# Patient Record
Sex: Female | Born: 1935 | Race: Black or African American | Hispanic: No
Health system: Southern US, Community
[De-identification: ages and names within clinical notes are randomized; demographics above are authoritative.]

## PROBLEM LIST (undated history)

## (undated) DIAGNOSIS — F99 Mental disorder, not otherwise specified: Secondary | ICD-10-CM

## (undated) DIAGNOSIS — E860 Dehydration: Secondary | ICD-10-CM

## (undated) DIAGNOSIS — I10 Essential (primary) hypertension: Secondary | ICD-10-CM

## (undated) DIAGNOSIS — C799 Secondary malignant neoplasm of unspecified site: Secondary | ICD-10-CM

## (undated) HISTORY — DX: Essential (primary) hypertension: I10

## (undated) HISTORY — DX: Secondary malignant neoplasm of unspecified site: C79.9

## (undated) HISTORY — DX: Mental disorder, not otherwise specified: F99

## (undated) HISTORY — DX: Dehydration: E86.0

---

## 1997-09-01 ENCOUNTER — Ambulatory Visit (HOSPITAL_COMMUNITY): Admission: RE | Admit: 1997-09-01 | Discharge: 1997-09-01 | Payer: Self-pay | Admitting: Internal Medicine

## 1997-10-27 ENCOUNTER — Other Ambulatory Visit: Admission: RE | Admit: 1997-10-27 | Discharge: 1997-10-27 | Payer: Self-pay | Admitting: Internal Medicine

## 1998-03-22 ENCOUNTER — Emergency Department (HOSPITAL_COMMUNITY): Admission: EM | Admit: 1998-03-22 | Discharge: 1998-03-22 | Payer: Self-pay | Admitting: Internal Medicine

## 1998-03-22 ENCOUNTER — Encounter: Payer: Self-pay | Admitting: Internal Medicine

## 1998-09-07 ENCOUNTER — Ambulatory Visit (HOSPITAL_COMMUNITY): Admission: RE | Admit: 1998-09-07 | Discharge: 1998-09-07 | Payer: Self-pay | Admitting: Internal Medicine

## 1999-02-25 ENCOUNTER — Ambulatory Visit (HOSPITAL_COMMUNITY): Admission: RE | Admit: 1999-02-25 | Discharge: 1999-02-25 | Payer: Self-pay | Admitting: Gastroenterology

## 1999-09-13 ENCOUNTER — Encounter: Payer: Self-pay | Admitting: Internal Medicine

## 1999-09-13 ENCOUNTER — Ambulatory Visit (HOSPITAL_COMMUNITY): Admission: RE | Admit: 1999-09-13 | Discharge: 1999-09-13 | Payer: Self-pay | Admitting: Unknown Physician Specialty

## 2000-09-25 ENCOUNTER — Ambulatory Visit (HOSPITAL_COMMUNITY): Admission: RE | Admit: 2000-09-25 | Discharge: 2000-09-25 | Payer: Self-pay | Admitting: Internal Medicine

## 2000-09-25 ENCOUNTER — Encounter: Payer: Self-pay | Admitting: Internal Medicine

## 2000-12-25 ENCOUNTER — Encounter: Admission: RE | Admit: 2000-12-25 | Discharge: 2001-03-25 | Payer: Self-pay | Admitting: Internal Medicine

## 2001-06-18 ENCOUNTER — Encounter: Admission: RE | Admit: 2001-06-18 | Discharge: 2001-09-16 | Payer: Self-pay | Admitting: Internal Medicine

## 2001-09-27 ENCOUNTER — Encounter: Payer: Self-pay | Admitting: Internal Medicine

## 2001-09-27 ENCOUNTER — Ambulatory Visit (HOSPITAL_COMMUNITY): Admission: RE | Admit: 2001-09-27 | Discharge: 2001-09-27 | Payer: Self-pay | Admitting: Internal Medicine

## 2002-03-18 ENCOUNTER — Encounter: Admission: RE | Admit: 2002-03-18 | Discharge: 2002-03-18 | Payer: Self-pay | Admitting: Internal Medicine

## 2002-03-18 ENCOUNTER — Encounter: Payer: Self-pay | Admitting: Internal Medicine

## 2002-06-14 ENCOUNTER — Other Ambulatory Visit: Admission: RE | Admit: 2002-06-14 | Discharge: 2002-06-14 | Payer: Self-pay | Admitting: Diagnostic Radiology

## 2002-06-14 ENCOUNTER — Encounter: Payer: Self-pay | Admitting: Internal Medicine

## 2002-06-14 ENCOUNTER — Encounter (INDEPENDENT_AMBULATORY_CARE_PROVIDER_SITE_OTHER): Payer: Self-pay | Admitting: Specialist

## 2002-06-14 ENCOUNTER — Encounter: Admission: RE | Admit: 2002-06-14 | Discharge: 2002-06-14 | Payer: Self-pay | Admitting: Internal Medicine

## 2002-07-01 ENCOUNTER — Encounter: Payer: Self-pay | Admitting: General Surgery

## 2002-07-01 ENCOUNTER — Encounter: Admission: RE | Admit: 2002-07-01 | Discharge: 2002-07-01 | Payer: Self-pay | Admitting: General Surgery

## 2002-07-03 ENCOUNTER — Encounter (INDEPENDENT_AMBULATORY_CARE_PROVIDER_SITE_OTHER): Payer: Self-pay

## 2002-07-03 ENCOUNTER — Encounter: Payer: Self-pay | Admitting: General Surgery

## 2002-07-03 ENCOUNTER — Ambulatory Visit (HOSPITAL_BASED_OUTPATIENT_CLINIC_OR_DEPARTMENT_OTHER): Admission: RE | Admit: 2002-07-03 | Discharge: 2002-07-04 | Payer: Self-pay | Admitting: General Surgery

## 2002-07-18 ENCOUNTER — Ambulatory Visit (HOSPITAL_COMMUNITY): Admission: RE | Admit: 2002-07-18 | Discharge: 2002-07-18 | Payer: Self-pay | Admitting: Oncology

## 2002-07-18 ENCOUNTER — Encounter: Payer: Self-pay | Admitting: Oncology

## 2002-07-23 ENCOUNTER — Ambulatory Visit: Admission: RE | Admit: 2002-07-23 | Discharge: 2002-10-21 | Payer: Self-pay | Admitting: *Deleted

## 2002-12-23 ENCOUNTER — Other Ambulatory Visit: Admission: RE | Admit: 2002-12-23 | Discharge: 2002-12-23 | Payer: Self-pay | Admitting: Internal Medicine

## 2003-01-06 ENCOUNTER — Encounter: Payer: Self-pay | Admitting: Internal Medicine

## 2003-01-06 ENCOUNTER — Encounter: Admission: RE | Admit: 2003-01-06 | Discharge: 2003-01-06 | Payer: Self-pay | Admitting: Internal Medicine

## 2003-02-03 ENCOUNTER — Encounter: Admission: RE | Admit: 2003-02-03 | Discharge: 2003-02-03 | Payer: Self-pay | Admitting: General Surgery

## 2003-06-16 ENCOUNTER — Encounter: Admission: RE | Admit: 2003-06-16 | Discharge: 2003-06-16 | Payer: Self-pay | Admitting: Internal Medicine

## 2004-01-26 ENCOUNTER — Ambulatory Visit: Payer: Self-pay | Admitting: Oncology

## 2004-03-29 ENCOUNTER — Encounter: Admission: RE | Admit: 2004-03-29 | Discharge: 2004-03-29 | Payer: Self-pay | Admitting: Internal Medicine

## 2004-07-23 ENCOUNTER — Ambulatory Visit: Payer: Self-pay | Admitting: Oncology

## 2004-07-26 ENCOUNTER — Encounter: Admission: RE | Admit: 2004-07-26 | Discharge: 2004-07-26 | Payer: Self-pay | Admitting: General Surgery

## 2004-08-02 ENCOUNTER — Encounter: Admission: RE | Admit: 2004-08-02 | Discharge: 2004-08-02 | Payer: Self-pay | Admitting: General Surgery

## 2005-01-28 ENCOUNTER — Ambulatory Visit: Payer: Self-pay | Admitting: Oncology

## 2005-08-02 ENCOUNTER — Encounter: Admission: RE | Admit: 2005-08-02 | Discharge: 2005-08-02 | Payer: Self-pay | Admitting: General Surgery

## 2006-01-25 ENCOUNTER — Ambulatory Visit: Payer: Self-pay | Admitting: Oncology

## 2006-01-30 LAB — CBC WITH DIFFERENTIAL/PLATELET
BASO%: 0.2 % (ref 0.0–2.0)
Eosinophils Absolute: 0.1 10*3/uL (ref 0.0–0.5)
MCHC: 33.5 g/dL (ref 32.0–36.0)
MCV: 88.7 fL (ref 81.0–101.0)
MONO%: 12 % (ref 0.0–13.0)
NEUT#: 2.3 10*3/uL (ref 1.5–6.5)
RBC: 3.96 10*6/uL (ref 3.70–5.32)
RDW: 15.1 % — ABNORMAL HIGH (ref 11.3–14.5)
WBC: 3.7 10*3/uL — ABNORMAL LOW (ref 3.9–10.0)

## 2006-08-14 ENCOUNTER — Encounter: Admission: RE | Admit: 2006-08-14 | Discharge: 2006-08-14 | Payer: Self-pay | Admitting: General Surgery

## 2007-01-18 ENCOUNTER — Ambulatory Visit: Payer: Self-pay | Admitting: Oncology

## 2007-01-22 LAB — CBC WITH DIFFERENTIAL/PLATELET
BASO%: 1.2 % (ref 0.0–2.0)
Eosinophils Absolute: 0.2 10*3/uL (ref 0.0–0.5)
LYMPH%: 23.3 % (ref 14.0–48.0)
MCHC: 34.7 g/dL (ref 32.0–36.0)
MONO#: 0.4 10*3/uL (ref 0.1–0.9)
NEUT#: 2.4 10*3/uL (ref 1.5–6.5)
Platelets: 178 10*3/uL (ref 145–400)
RBC: 4.01 10*6/uL (ref 3.70–5.32)
RDW: 14.6 % — ABNORMAL HIGH (ref 11.3–14.5)
WBC: 4 10*3/uL (ref 3.9–10.0)
lymph#: 0.9 10*3/uL (ref 0.9–3.3)

## 2007-01-22 LAB — COMPREHENSIVE METABOLIC PANEL
ALT: 14 U/L (ref 0–35)
Albumin: 3.8 g/dL (ref 3.5–5.2)
CO2: 25 mEq/L (ref 19–32)
Calcium: 9.8 mg/dL (ref 8.4–10.5)
Chloride: 104 mEq/L (ref 96–112)
Glucose, Bld: 94 mg/dL (ref 70–99)
Potassium: 3.9 mEq/L (ref 3.5–5.3)
Sodium: 141 mEq/L (ref 135–145)
Total Protein: 7.3 g/dL (ref 6.0–8.3)

## 2007-01-22 LAB — CANCER ANTIGEN 27.29: CA 27.29: 30 U/mL (ref 0–39)

## 2007-01-26 ENCOUNTER — Encounter (INDEPENDENT_AMBULATORY_CARE_PROVIDER_SITE_OTHER): Payer: Self-pay | Admitting: Urology

## 2007-01-26 ENCOUNTER — Ambulatory Visit (HOSPITAL_BASED_OUTPATIENT_CLINIC_OR_DEPARTMENT_OTHER): Admission: RE | Admit: 2007-01-26 | Discharge: 2007-01-26 | Payer: Self-pay | Admitting: Urology

## 2007-05-04 ENCOUNTER — Encounter (INDEPENDENT_AMBULATORY_CARE_PROVIDER_SITE_OTHER): Payer: Self-pay | Admitting: Urology

## 2007-05-04 ENCOUNTER — Ambulatory Visit (HOSPITAL_BASED_OUTPATIENT_CLINIC_OR_DEPARTMENT_OTHER): Admission: RE | Admit: 2007-05-04 | Discharge: 2007-05-04 | Payer: Self-pay | Admitting: Urology

## 2007-07-11 ENCOUNTER — Ambulatory Visit: Payer: Self-pay | Admitting: Oncology

## 2007-07-16 LAB — CBC WITH DIFFERENTIAL/PLATELET
EOS%: 4.4 % (ref 0.0–7.0)
LYMPH%: 21.4 % (ref 14.0–48.0)
MCH: 29.3 pg (ref 26.0–34.0)
MCV: 86.4 fL (ref 81.0–101.0)
MONO%: 10.7 % (ref 0.0–13.0)
RBC: 3.99 10*6/uL (ref 3.70–5.32)
RDW: 14.9 % — ABNORMAL HIGH (ref 11.3–14.5)

## 2007-08-16 ENCOUNTER — Encounter: Admission: RE | Admit: 2007-08-16 | Discharge: 2007-08-16 | Payer: Self-pay | Admitting: Oncology

## 2007-11-20 ENCOUNTER — Ambulatory Visit (HOSPITAL_COMMUNITY): Admission: RE | Admit: 2007-11-20 | Discharge: 2007-11-20 | Payer: Self-pay | Admitting: Internal Medicine

## 2008-01-10 ENCOUNTER — Ambulatory Visit: Payer: Self-pay | Admitting: Oncology

## 2008-01-14 LAB — CBC WITH DIFFERENTIAL/PLATELET
BASO%: 0.4 % (ref 0.0–2.0)
HCT: 33.9 % — ABNORMAL LOW (ref 34.8–46.6)
MCHC: 33.7 g/dL (ref 32.0–36.0)
MONO#: 0.3 10*3/uL (ref 0.1–0.9)
NEUT%: 69.3 % (ref 39.6–76.8)
RBC: 3.84 10*6/uL (ref 3.70–5.32)
WBC: 4 10*3/uL (ref 3.9–10.0)
lymph#: 0.8 10*3/uL — ABNORMAL LOW (ref 0.9–3.3)

## 2008-01-15 LAB — COMPREHENSIVE METABOLIC PANEL
ALT: 13 U/L (ref 0–35)
Albumin: 3.6 g/dL (ref 3.5–5.2)
CO2: 24 mEq/L (ref 19–32)
Calcium: 9.3 mg/dL (ref 8.4–10.5)
Chloride: 104 mEq/L (ref 96–112)
Potassium: 3.7 mEq/L (ref 3.5–5.3)
Sodium: 140 mEq/L (ref 135–145)
Total Protein: 6.8 g/dL (ref 6.0–8.3)

## 2008-01-15 LAB — CANCER ANTIGEN 27.29: CA 27.29: 28 U/mL (ref 0–39)

## 2008-01-26 ENCOUNTER — Ambulatory Visit (HOSPITAL_COMMUNITY): Admission: RE | Admit: 2008-01-26 | Discharge: 2008-01-26 | Payer: Self-pay | Admitting: Urology

## 2008-05-02 ENCOUNTER — Ambulatory Visit (HOSPITAL_COMMUNITY): Admission: RE | Admit: 2008-05-02 | Discharge: 2008-05-02 | Payer: Self-pay | Admitting: Urology

## 2008-08-20 ENCOUNTER — Encounter: Admission: RE | Admit: 2008-08-20 | Discharge: 2008-08-20 | Payer: Self-pay | Admitting: Oncology

## 2008-11-21 ENCOUNTER — Ambulatory Visit (HOSPITAL_COMMUNITY): Admission: RE | Admit: 2008-11-21 | Discharge: 2008-11-21 | Payer: Self-pay | Admitting: Urology

## 2008-11-24 ENCOUNTER — Ambulatory Visit (HOSPITAL_COMMUNITY): Admission: RE | Admit: 2008-11-24 | Discharge: 2008-11-24 | Payer: Self-pay | Admitting: Urology

## 2009-01-01 ENCOUNTER — Ambulatory Visit: Payer: Self-pay | Admitting: Oncology

## 2009-01-13 ENCOUNTER — Inpatient Hospital Stay (HOSPITAL_COMMUNITY): Admission: AD | Admit: 2009-01-13 | Discharge: 2009-01-30 | Payer: Self-pay | Admitting: Internal Medicine

## 2009-01-20 ENCOUNTER — Ambulatory Visit: Payer: Self-pay | Admitting: Internal Medicine

## 2009-01-25 ENCOUNTER — Encounter: Payer: Self-pay | Admitting: Internal Medicine

## 2009-02-06 ENCOUNTER — Encounter: Admission: RE | Admit: 2009-02-06 | Discharge: 2009-02-06 | Payer: Self-pay | Admitting: General Surgery

## 2009-02-17 ENCOUNTER — Encounter: Admission: RE | Admit: 2009-02-17 | Discharge: 2009-02-17 | Payer: Self-pay | Admitting: General Surgery

## 2009-03-20 ENCOUNTER — Ambulatory Visit: Payer: Self-pay | Admitting: Oncology

## 2009-04-16 ENCOUNTER — Encounter (INDEPENDENT_AMBULATORY_CARE_PROVIDER_SITE_OTHER): Payer: Self-pay | Admitting: General Surgery

## 2009-04-18 ENCOUNTER — Inpatient Hospital Stay (HOSPITAL_COMMUNITY): Admission: RE | Admit: 2009-04-18 | Discharge: 2009-04-21 | Payer: Self-pay | Admitting: General Surgery

## 2009-04-18 ENCOUNTER — Encounter (INDEPENDENT_AMBULATORY_CARE_PROVIDER_SITE_OTHER): Payer: Self-pay | Admitting: General Surgery

## 2009-04-20 ENCOUNTER — Encounter (INDEPENDENT_AMBULATORY_CARE_PROVIDER_SITE_OTHER): Payer: Self-pay | Admitting: General Surgery

## 2009-04-20 ENCOUNTER — Ambulatory Visit: Payer: Self-pay | Admitting: Surgery

## 2009-04-20 ENCOUNTER — Ambulatory Visit: Payer: Self-pay | Admitting: Oncology

## 2009-04-22 ENCOUNTER — Ambulatory Visit: Payer: Self-pay | Admitting: Oncology

## 2009-04-27 LAB — PROTIME-INR: INR: 2.3 (ref 2.00–3.50)

## 2009-04-27 LAB — CBC WITH DIFFERENTIAL/PLATELET
BASO%: 0.3 % (ref 0.0–2.0)
EOS%: 3.1 % (ref 0.0–7.0)
HCT: 28.1 % — ABNORMAL LOW (ref 34.8–46.6)
MCH: 29 pg (ref 25.1–34.0)
MCHC: 32.8 g/dL (ref 31.5–36.0)
MCV: 88.4 fL (ref 79.5–101.0)
MONO%: 6.3 % (ref 0.0–14.0)
NEUT%: 77 % — ABNORMAL HIGH (ref 38.4–76.8)
lymph#: 1 10*3/uL (ref 0.9–3.3)

## 2009-04-27 LAB — COMPREHENSIVE METABOLIC PANEL
ALT: 30 U/L (ref 0–35)
AST: 40 U/L — ABNORMAL HIGH (ref 0–37)
BUN: 9 mg/dL (ref 6–23)
Creatinine, Ser: 1.13 mg/dL (ref 0.40–1.20)
Total Bilirubin: 0.5 mg/dL (ref 0.3–1.2)

## 2009-05-04 LAB — PROTIME-INR: Protime: 33.6 Seconds — ABNORMAL HIGH (ref 10.6–13.4)

## 2009-05-11 LAB — PROTIME-INR
INR: 1.9 — ABNORMAL LOW (ref 2.00–3.50)
Protime: 22.8 Seconds — ABNORMAL HIGH (ref 10.6–13.4)

## 2009-05-18 LAB — COMPREHENSIVE METABOLIC PANEL
AST: 37 U/L (ref 0–37)
Albumin: 3.2 g/dL — ABNORMAL LOW (ref 3.5–5.2)
Alkaline Phosphatase: 59 U/L (ref 39–117)
Glucose, Bld: 139 mg/dL — ABNORMAL HIGH (ref 70–99)
Potassium: 3.1 mEq/L — ABNORMAL LOW (ref 3.5–5.3)
Sodium: 145 mEq/L (ref 135–145)
Total Protein: 6.9 g/dL (ref 6.0–8.3)

## 2009-05-18 LAB — CBC WITH DIFFERENTIAL/PLATELET
Basophils Absolute: 0 10*3/uL (ref 0.0–0.1)
Eosinophils Absolute: 0.2 10*3/uL (ref 0.0–0.5)
HCT: 31.6 % — ABNORMAL LOW (ref 34.8–46.6)
HGB: 10.5 g/dL — ABNORMAL LOW (ref 11.6–15.9)
LYMPH%: 26.3 % (ref 14.0–49.7)
MONO#: 0.3 10*3/uL (ref 0.1–0.9)
NEUT#: 2.5 10*3/uL (ref 1.5–6.5)
Platelets: 152 10*3/uL (ref 145–400)
RBC: 3.55 10*6/uL — ABNORMAL LOW (ref 3.70–5.45)
WBC: 4.1 10*3/uL (ref 3.9–10.3)

## 2009-05-18 LAB — PROTIME-INR: Protime: 31.2 Seconds — ABNORMAL HIGH (ref 10.6–13.4)

## 2009-05-29 ENCOUNTER — Ambulatory Visit: Payer: Self-pay | Admitting: Oncology

## 2009-06-02 ENCOUNTER — Ambulatory Visit (HOSPITAL_COMMUNITY): Admission: RE | Admit: 2009-06-02 | Discharge: 2009-06-02 | Payer: Self-pay | Admitting: Oncology

## 2009-06-02 LAB — CBC WITH DIFFERENTIAL/PLATELET
Basophils Absolute: 0 10*3/uL (ref 0.0–0.1)
EOS%: 4.5 % (ref 0.0–7.0)
HCT: 34.3 % — ABNORMAL LOW (ref 34.8–46.6)
HGB: 11.1 g/dL — ABNORMAL LOW (ref 11.6–15.9)
MCH: 28.1 pg (ref 25.1–34.0)
MCV: 86.8 fL (ref 79.5–101.0)
MONO%: 6.8 % (ref 0.0–14.0)
NEUT%: 61.5 % (ref 38.4–76.8)

## 2009-06-02 LAB — COMPREHENSIVE METABOLIC PANEL
AST: 33 U/L (ref 0–37)
Alkaline Phosphatase: 57 U/L (ref 39–117)
BUN: 14 mg/dL (ref 6–23)
Calcium: 9.4 mg/dL (ref 8.4–10.5)
Creatinine, Ser: 0.94 mg/dL (ref 0.40–1.20)

## 2009-06-02 LAB — PROTIME-INR: INR: 1.6 — ABNORMAL LOW (ref 2.00–3.50)

## 2009-06-08 LAB — PROTIME-INR: INR: 2.1 (ref 2.00–3.50)

## 2009-06-12 LAB — BASIC METABOLIC PANEL
BUN: 10 mg/dL (ref 4–21)
Creatinine: 1 mg/dL (ref 0.5–1.1)

## 2009-06-25 LAB — PROTIME-INR: INR: 1.2 — ABNORMAL LOW (ref 2.00–3.50)

## 2009-06-26 ENCOUNTER — Ambulatory Visit: Payer: Self-pay | Admitting: Oncology

## 2009-06-26 ENCOUNTER — Ambulatory Visit (HOSPITAL_COMMUNITY): Admission: RE | Admit: 2009-06-26 | Discharge: 2009-06-26 | Payer: Self-pay | Admitting: Oncology

## 2009-06-29 LAB — PROTIME-INR
INR: 1.2 — ABNORMAL LOW (ref 2.00–3.50)
Protime: 14.4 Seconds — ABNORMAL HIGH (ref 10.6–13.4)

## 2009-07-01 LAB — PROTIME-INR: Protime: 20.4 Seconds — ABNORMAL HIGH (ref 10.6–13.4)

## 2009-07-08 LAB — PROTIME-INR: Protime: 49.2 Seconds — ABNORMAL HIGH (ref 10.6–13.4)

## 2009-07-15 LAB — CBC WITH DIFFERENTIAL/PLATELET
BASO%: 0.5 % (ref 0.0–2.0)
EOS%: 4.5 % (ref 0.0–7.0)
LYMPH%: 23.8 % (ref 14.0–49.7)
MCH: 29.4 pg (ref 25.1–34.0)
MCHC: 33.3 g/dL (ref 31.5–36.0)
MONO#: 0.3 10*3/uL (ref 0.1–0.9)
Platelets: 169 10*3/uL (ref 145–400)
RBC: 4.01 10*6/uL (ref 3.70–5.45)
WBC: 3.6 10*3/uL — ABNORMAL LOW (ref 3.9–10.3)

## 2009-07-15 LAB — COMPREHENSIVE METABOLIC PANEL
AST: 21 U/L (ref 0–37)
Albumin: 3.8 g/dL (ref 3.5–5.2)
BUN: 17 mg/dL (ref 6–23)
Calcium: 9.5 mg/dL (ref 8.4–10.5)
Chloride: 104 mEq/L (ref 96–112)
Potassium: 4.1 mEq/L (ref 3.5–5.3)

## 2009-07-15 LAB — PROTIME-INR: Protime: 49.2 Seconds — ABNORMAL HIGH (ref 10.6–13.4)

## 2009-07-22 LAB — CBC & DIFF AND RETIC
BASO%: 0.7 % (ref 0.0–2.0)
Basophils Absolute: 0 10*3/uL (ref 0.0–0.1)
EOS%: 4.2 % (ref 0.0–7.0)
HGB: 11.9 g/dL (ref 11.6–15.9)
Immature Retic Fract: 7.8 % (ref 0.00–10.70)
MCH: 28.3 pg (ref 25.1–34.0)
MCHC: 32.9 g/dL (ref 31.5–36.0)
RDW: 15.1 % — ABNORMAL HIGH (ref 11.2–14.5)
Retic %: 0.55 % (ref 0.50–1.50)
Retic Ct Abs: 23.1 10*3/uL (ref 18.30–72.70)
lymph#: 1.1 10*3/uL (ref 0.9–3.3)

## 2009-07-22 LAB — COMPREHENSIVE METABOLIC PANEL
ALT: 15 U/L (ref 0–35)
AST: 23 U/L (ref 0–37)
Alkaline Phosphatase: 52 U/L (ref 39–117)
Creatinine, Ser: 1.16 mg/dL (ref 0.40–1.20)
Total Bilirubin: 0.5 mg/dL (ref 0.3–1.2)

## 2009-07-28 ENCOUNTER — Ambulatory Visit: Payer: Self-pay | Admitting: Oncology

## 2009-07-29 LAB — PROTIME-INR
INR: 2.8 (ref 2.00–3.50)
Protime: 33.6 Seconds — ABNORMAL HIGH (ref 10.6–13.4)

## 2009-08-05 LAB — COMPREHENSIVE METABOLIC PANEL
AST: 21 U/L (ref 0–37)
Albumin: 3.7 g/dL (ref 3.5–5.2)
Alkaline Phosphatase: 51 U/L (ref 39–117)
BUN: 18 mg/dL (ref 6–23)
Potassium: 3.6 mEq/L (ref 3.5–5.3)
Sodium: 142 mEq/L (ref 135–145)
Total Bilirubin: 0.4 mg/dL (ref 0.3–1.2)

## 2009-08-05 LAB — CBC WITH DIFFERENTIAL/PLATELET
BASO%: 2.2 % — ABNORMAL HIGH (ref 0.0–2.0)
Eosinophils Absolute: 0.2 10*3/uL (ref 0.0–0.5)
MCHC: 33.3 g/dL (ref 31.5–36.0)
MONO#: 0.3 10*3/uL (ref 0.1–0.9)
MONO%: 8.2 % (ref 0.0–14.0)
NEUT#: 2.2 10*3/uL (ref 1.5–6.5)
RBC: 3.9 10*6/uL (ref 3.70–5.45)
RDW: 16 % — ABNORMAL HIGH (ref 11.2–14.5)
WBC: 3.4 10*3/uL — ABNORMAL LOW (ref 3.9–10.3)

## 2009-08-05 LAB — PROTIME-INR
INR: 2 (ref 2.00–3.50)
Protime: 24 Seconds — ABNORMAL HIGH (ref 10.6–13.4)

## 2009-08-12 LAB — PROTIME-INR
INR: 2.2 (ref 2.00–3.50)
Protime: 26.4 Seconds — ABNORMAL HIGH (ref 10.6–13.4)

## 2009-08-17 LAB — CBC WITH DIFFERENTIAL/PLATELET
Basophils Absolute: 0 10*3/uL (ref 0.0–0.1)
Eosinophils Absolute: 0.3 10*3/uL (ref 0.0–0.5)
HGB: 12.1 g/dL (ref 11.6–15.9)
MONO#: 0.4 10*3/uL (ref 0.1–0.9)
MONO%: 10.2 % (ref 0.0–14.0)
NEUT#: 2.1 10*3/uL (ref 1.5–6.5)
RBC: 4.03 10*6/uL (ref 3.70–5.45)
RDW: 17.1 % — ABNORMAL HIGH (ref 11.2–14.5)
WBC: 3.5 10*3/uL — ABNORMAL LOW (ref 3.9–10.3)
lymph#: 0.8 10*3/uL — ABNORMAL LOW (ref 0.9–3.3)

## 2009-08-17 LAB — COMPREHENSIVE METABOLIC PANEL
ALT: 16 U/L (ref 0–35)
AST: 27 U/L (ref 0–37)
Alkaline Phosphatase: 55 U/L (ref 39–117)
Potassium: 3.8 mEq/L (ref 3.5–5.3)
Sodium: 142 mEq/L (ref 135–145)
Total Bilirubin: 0.3 mg/dL (ref 0.3–1.2)
Total Protein: 7.2 g/dL (ref 6.0–8.3)

## 2009-08-17 LAB — PROTIME-INR
INR: 2.6 (ref 2.00–3.50)
Protime: 31.2 Seconds — ABNORMAL HIGH (ref 10.6–13.4)

## 2009-08-20 ENCOUNTER — Ambulatory Visit: Admission: RE | Admit: 2009-08-20 | Discharge: 2009-09-28 | Payer: Self-pay | Admitting: Radiation Oncology

## 2009-08-24 ENCOUNTER — Encounter: Admission: RE | Admit: 2009-08-24 | Discharge: 2009-08-24 | Payer: Self-pay | Admitting: Oncology

## 2009-08-26 LAB — CBC WITH DIFFERENTIAL/PLATELET
EOS%: 7.7 % — ABNORMAL HIGH (ref 0.0–7.0)
Eosinophils Absolute: 0.3 10*3/uL (ref 0.0–0.5)
LYMPH%: 25.6 % (ref 14.0–49.7)
MCH: 29.4 pg (ref 25.1–34.0)
MCHC: 33.5 g/dL (ref 31.5–36.0)
MCV: 88 fL (ref 79.5–101.0)
MONO%: 5.8 % (ref 0.0–14.0)
NEUT#: 2.2 10*3/uL (ref 1.5–6.5)
Platelets: 167 10*3/uL (ref 145–400)
RBC: 3.99 10*6/uL (ref 3.70–5.45)

## 2009-08-26 LAB — PROTIME-INR: Protime: 36 Seconds — ABNORMAL HIGH (ref 10.6–13.4)

## 2009-08-31 ENCOUNTER — Ambulatory Visit: Payer: Self-pay | Admitting: Oncology

## 2009-09-02 LAB — CBC WITH DIFFERENTIAL/PLATELET
Basophils Absolute: 0 10*3/uL (ref 0.0–0.1)
Eosinophils Absolute: 0.3 10*3/uL (ref 0.0–0.5)
HGB: 11.3 g/dL — ABNORMAL LOW (ref 11.6–15.9)
NEUT#: 2 10*3/uL (ref 1.5–6.5)
RDW: 17.3 % — ABNORMAL HIGH (ref 11.2–14.5)
lymph#: 0.8 10*3/uL — ABNORMAL LOW (ref 0.9–3.3)

## 2009-09-02 LAB — PROTIME-INR: INR: 3.2 (ref 2.00–3.50)

## 2009-09-02 LAB — COMPREHENSIVE METABOLIC PANEL
Albumin: 4.3 g/dL (ref 3.5–5.2)
Alkaline Phosphatase: 50 U/L (ref 39–117)
BUN: 18 mg/dL (ref 6–23)
Calcium: 9.9 mg/dL (ref 8.4–10.5)
Chloride: 102 mEq/L (ref 96–112)
Glucose, Bld: 100 mg/dL — ABNORMAL HIGH (ref 70–99)
Potassium: 4.5 mEq/L (ref 3.5–5.3)

## 2009-09-09 LAB — CBC WITH DIFFERENTIAL/PLATELET
Basophils Absolute: 0 10*3/uL (ref 0.0–0.1)
EOS%: 10.6 % — ABNORMAL HIGH (ref 0.0–7.0)
Eosinophils Absolute: 0.2 10*3/uL (ref 0.0–0.5)
HGB: 11 g/dL — ABNORMAL LOW (ref 11.6–15.9)
NEUT#: 0.8 10*3/uL — ABNORMAL LOW (ref 1.5–6.5)
RDW: 16.6 % — ABNORMAL HIGH (ref 11.2–14.5)
lymph#: 0.6 10*3/uL — ABNORMAL LOW (ref 0.9–3.3)

## 2009-09-09 LAB — PROTIME-INR

## 2009-09-09 LAB — PROTHROMBIN TIME: INR: 4.39 — ABNORMAL HIGH (ref <1.50)

## 2009-09-16 LAB — CBC WITH DIFFERENTIAL/PLATELET
Basophils Absolute: 0 10*3/uL (ref 0.0–0.1)
Eosinophils Absolute: 0.2 10*3/uL (ref 0.0–0.5)
HCT: 32.1 % — ABNORMAL LOW (ref 34.8–46.6)
LYMPH%: 39.9 % (ref 14.0–49.7)
MONO#: 0.2 10*3/uL (ref 0.1–0.9)
NEUT#: 0.5 10*3/uL — ABNORMAL LOW (ref 1.5–6.5)
NEUT%: 33 % — ABNORMAL LOW (ref 38.4–76.8)
Platelets: 148 10*3/uL (ref 145–400)
WBC: 1.5 10*3/uL — ABNORMAL LOW (ref 3.9–10.3)

## 2009-09-18 LAB — PROTIME-INR
INR: 3.9 — ABNORMAL HIGH (ref 2.00–3.50)
Protime: 46.8 Seconds — ABNORMAL HIGH (ref 10.6–13.4)

## 2009-09-21 LAB — PROTIME-INR
INR: 2.8 (ref 2.00–3.50)
Protime: 33.6 Seconds — ABNORMAL HIGH (ref 10.6–13.4)

## 2009-09-23 LAB — CBC WITH DIFFERENTIAL/PLATELET
Basophils Absolute: 0 10*3/uL (ref 0.0–0.1)
EOS%: 2.2 % (ref 0.0–7.0)
Eosinophils Absolute: 0.1 10*3/uL (ref 0.0–0.5)
LYMPH%: 26.7 % (ref 14.0–49.7)
MCH: 29.4 pg (ref 25.1–34.0)
MCV: 87.7 fL (ref 79.5–101.0)
MONO%: 26.3 % — ABNORMAL HIGH (ref 0.0–14.0)
Platelets: 160 10*3/uL (ref 145–400)
RBC: 3.91 10*6/uL (ref 3.70–5.45)
RDW: 19.3 % — ABNORMAL HIGH (ref 11.2–14.5)

## 2009-09-23 LAB — COMPREHENSIVE METABOLIC PANEL
ALT: 11 U/L (ref 0–35)
AST: 20 U/L (ref 0–37)
Albumin: 3.9 g/dL (ref 3.5–5.2)
Alkaline Phosphatase: 50 U/L (ref 39–117)
Calcium: 9.8 mg/dL (ref 8.4–10.5)
Chloride: 96 mEq/L (ref 96–112)
Potassium: 3.6 mEq/L (ref 3.5–5.3)
Sodium: 137 mEq/L (ref 135–145)

## 2009-09-23 LAB — PROTIME-INR
INR: 2 (ref 2.00–3.50)
Protime: 24 Seconds — ABNORMAL HIGH (ref 10.6–13.4)

## 2009-09-30 ENCOUNTER — Ambulatory Visit: Payer: Self-pay | Admitting: Oncology

## 2009-09-30 LAB — CBC WITH DIFFERENTIAL/PLATELET
Basophils Absolute: 0 10*3/uL (ref 0.0–0.1)
Eosinophils Absolute: 0.1 10*3/uL (ref 0.0–0.5)
HGB: 11.2 g/dL — ABNORMAL LOW (ref 11.6–15.9)
MCV: 91.3 fL (ref 79.5–101.0)
MONO#: 0.6 10*3/uL (ref 0.1–0.9)
NEUT#: 3.8 10*3/uL (ref 1.5–6.5)
RBC: 3.65 10*6/uL — ABNORMAL LOW (ref 3.70–5.45)
RDW: 23.2 % — ABNORMAL HIGH (ref 11.2–14.5)
WBC: 5 10*3/uL (ref 3.9–10.3)

## 2009-09-30 LAB — PROTIME-INR
INR: 1.7 — ABNORMAL LOW (ref 2.00–3.50)
Protime: 20.4 Seconds — ABNORMAL HIGH (ref 10.6–13.4)

## 2009-10-26 ENCOUNTER — Ambulatory Visit (HOSPITAL_COMMUNITY): Admission: RE | Admit: 2009-10-26 | Discharge: 2009-10-26 | Payer: Self-pay | Admitting: Oncology

## 2009-10-30 ENCOUNTER — Ambulatory Visit: Payer: Self-pay | Admitting: Oncology

## 2009-11-02 LAB — CBC WITH DIFFERENTIAL/PLATELET
BASO%: 0 % (ref 0.0–2.0)
HCT: 31.6 % — ABNORMAL LOW (ref 34.8–46.6)
LYMPH%: 9.9 % — ABNORMAL LOW (ref 14.0–49.7)
MCHC: 33.5 g/dL (ref 31.5–36.0)
MONO#: 0.4 10*3/uL (ref 0.1–0.9)
NEUT%: 78.2 % — ABNORMAL HIGH (ref 38.4–76.8)
Platelets: 250 10*3/uL (ref 145–400)
WBC: 5.4 10*3/uL (ref 3.9–10.3)

## 2009-11-18 LAB — CBC WITH DIFFERENTIAL/PLATELET
BASO%: 0.1 % (ref 0.0–2.0)
Basophils Absolute: 0 10*3/uL (ref 0.0–0.1)
EOS%: 3.4 % (ref 0.0–7.0)
HCT: 32.8 % — ABNORMAL LOW (ref 34.8–46.6)
LYMPH%: 9.2 % — ABNORMAL LOW (ref 14.0–49.7)
MCH: 30.6 pg (ref 25.1–34.0)
MCHC: 32.9 g/dL (ref 31.5–36.0)
MCV: 93 fL (ref 79.5–101.0)
MONO%: 8.4 % (ref 0.0–14.0)
NEUT%: 78.9 % — ABNORMAL HIGH (ref 38.4–76.8)
lymph#: 0.6 10*3/uL — ABNORMAL LOW (ref 0.9–3.3)

## 2009-11-18 LAB — COMPREHENSIVE METABOLIC PANEL
ALT: 9 U/L (ref 0–35)
AST: 23 U/L (ref 0–37)
Alkaline Phosphatase: 57 U/L (ref 39–117)
BUN: 15 mg/dL (ref 6–23)
Chloride: 101 mEq/L (ref 96–112)
Creatinine, Ser: 0.87 mg/dL (ref 0.40–1.20)
Total Bilirubin: 0.5 mg/dL (ref 0.3–1.2)

## 2009-11-24 LAB — CBC WITH DIFFERENTIAL/PLATELET
Basophils Absolute: 0 10*3/uL (ref 0.0–0.1)
Eosinophils Absolute: 0.1 10*3/uL (ref 0.0–0.5)
HCT: 32.3 % — ABNORMAL LOW (ref 34.8–46.6)
HGB: 10.5 g/dL — ABNORMAL LOW (ref 11.6–15.9)
LYMPH%: 10 % — ABNORMAL LOW (ref 14.0–49.7)
MCV: 91.4 fL (ref 79.5–101.0)
MONO#: 0.3 10*3/uL (ref 0.1–0.9)
MONO%: 5.5 % (ref 0.0–14.0)
NEUT#: 4.2 10*3/uL (ref 1.5–6.5)
Platelets: 297 10*3/uL (ref 145–400)
RBC: 3.53 10*6/uL — ABNORMAL LOW (ref 3.70–5.45)
WBC: 5.2 10*3/uL (ref 3.9–10.3)

## 2009-11-24 LAB — COMPREHENSIVE METABOLIC PANEL
AST: 23 U/L (ref 0–37)
Albumin: 3.3 g/dL — ABNORMAL LOW (ref 3.5–5.2)
Alkaline Phosphatase: 56 U/L (ref 39–117)
BUN: 16 mg/dL (ref 6–23)
Calcium: 9.4 mg/dL (ref 8.4–10.5)
Chloride: 102 mEq/L (ref 96–112)
Glucose, Bld: 133 mg/dL — ABNORMAL HIGH (ref 70–99)
Potassium: 3.9 mEq/L (ref 3.5–5.3)
Sodium: 138 mEq/L (ref 135–145)
Total Protein: 7 g/dL (ref 6.0–8.3)

## 2009-11-24 LAB — CEA: CEA: 6.9 ng/mL — ABNORMAL HIGH (ref 0.0–5.0)

## 2009-11-24 LAB — PROTIME-INR: Protime: 48 Seconds — ABNORMAL HIGH (ref 10.6–13.4)

## 2009-11-27 LAB — PROTIME-INR
INR: 4.4 — ABNORMAL HIGH (ref 2.00–3.50)
Protime: 52.8 Seconds — ABNORMAL HIGH (ref 10.6–13.4)

## 2009-11-30 ENCOUNTER — Ambulatory Visit: Payer: Self-pay | Admitting: Oncology

## 2009-12-03 LAB — PROTIME-INR: Protime: 48 Seconds — ABNORMAL HIGH (ref 10.6–13.4)

## 2009-12-09 LAB — CBC WITH DIFFERENTIAL/PLATELET
BASO%: 0.4 % (ref 0.0–2.0)
EOS%: 1.6 % (ref 0.0–7.0)
Eosinophils Absolute: 0.1 10*3/uL (ref 0.0–0.5)
LYMPH%: 10.9 % — ABNORMAL LOW (ref 14.0–49.7)
MCH: 28.8 pg (ref 25.1–34.0)
MCHC: 32.7 g/dL (ref 31.5–36.0)
MCV: 88.1 fL (ref 79.5–101.0)
MONO%: 6.6 % (ref 0.0–14.0)
Platelets: 298 10*3/uL (ref 145–400)
RBC: 3.37 10*6/uL — ABNORMAL LOW (ref 3.70–5.45)
nRBC: 0 % (ref 0–0)

## 2009-12-14 LAB — CBC WITH DIFFERENTIAL/PLATELET
BASO%: 0.6 % (ref 0.0–2.0)
Basophils Absolute: 0 10*3/uL (ref 0.0–0.1)
HCT: 32.7 % — ABNORMAL LOW (ref 34.8–46.6)
HGB: 10.4 g/dL — ABNORMAL LOW (ref 11.6–15.9)
LYMPH%: 12 % — ABNORMAL LOW (ref 14.0–49.7)
MCH: 28.5 pg (ref 25.1–34.0)
MONO%: 7.2 % (ref 0.0–14.0)
RDW: 16.2 % — ABNORMAL HIGH (ref 11.2–14.5)

## 2009-12-14 LAB — COMPREHENSIVE METABOLIC PANEL
AST: 19 U/L (ref 0–37)
Albumin: 3.4 g/dL — ABNORMAL LOW (ref 3.5–5.2)
Alkaline Phosphatase: 59 U/L (ref 39–117)
Chloride: 102 mEq/L (ref 96–112)
Creatinine, Ser: 1.04 mg/dL (ref 0.40–1.20)
Sodium: 140 mEq/L (ref 135–145)
Total Protein: 7.1 g/dL (ref 6.0–8.3)

## 2009-12-14 LAB — PROTIME-INR

## 2009-12-21 LAB — PROTIME-INR: Protime: 26.4 Seconds — ABNORMAL HIGH (ref 10.6–13.4)

## 2009-12-28 LAB — PROTIME-INR
INR: 3.3 (ref 2.00–3.50)
Protime: 39.6 Seconds — ABNORMAL HIGH (ref 10.6–13.4)

## 2009-12-28 LAB — COMPREHENSIVE METABOLIC PANEL
Alkaline Phosphatase: 64 U/L (ref 39–117)
BUN: 19 mg/dL (ref 6–23)
CO2: 27 mEq/L (ref 19–32)
Glucose, Bld: 102 mg/dL — ABNORMAL HIGH (ref 70–99)
Total Bilirubin: 0.5 mg/dL (ref 0.3–1.2)

## 2009-12-28 LAB — CBC WITH DIFFERENTIAL/PLATELET
Eosinophils Absolute: 0.1 10*3/uL (ref 0.0–0.5)
MCHC: 33.2 g/dL (ref 31.5–36.0)
MCV: 88.1 fL (ref 79.5–101.0)
MONO#: 0.4 10*3/uL (ref 0.1–0.9)
NEUT%: 79.4 % — ABNORMAL HIGH (ref 38.4–76.8)
Platelets: 390 10*3/uL (ref 145–400)
RBC: 3.38 10*6/uL — ABNORMAL LOW (ref 3.70–5.45)
WBC: 5.3 10*3/uL (ref 3.9–10.3)
lymph#: 0.6 10*3/uL — ABNORMAL LOW (ref 0.9–3.3)

## 2009-12-31 ENCOUNTER — Ambulatory Visit: Payer: Self-pay | Admitting: Oncology

## 2010-01-04 ENCOUNTER — Ambulatory Visit (HOSPITAL_COMMUNITY): Admission: RE | Admit: 2010-01-04 | Discharge: 2010-01-04 | Payer: Self-pay | Admitting: Oncology

## 2010-01-04 LAB — PROTHROMBIN TIME
INR: 2.7 — ABNORMAL HIGH (ref <1.50)
Prothrombin Time: 28.8 seconds — ABNORMAL HIGH (ref 11.6–15.2)

## 2010-01-04 LAB — CBC WITH DIFFERENTIAL/PLATELET
BASO%: 0 % (ref 0.0–2.0)
EOS%: 0.5 % (ref 0.0–7.0)
Eosinophils Absolute: 0 10*3/uL (ref 0.0–0.5)
LYMPH%: 5 % — ABNORMAL LOW (ref 14.0–49.7)
MCHC: 33.1 g/dL (ref 31.5–36.0)
MONO#: 0.1 10*3/uL (ref 0.1–0.9)
MONO%: 2.7 % (ref 0.0–14.0)
Platelets: 349 10*3/uL (ref 145–400)
WBC: 5.3 10*3/uL (ref 3.9–10.3)
lymph#: 0.3 10*3/uL — ABNORMAL LOW (ref 0.9–3.3)

## 2010-01-05 LAB — COMPREHENSIVE METABOLIC PANEL
BUN: 18 mg/dL (ref 6–23)
CO2: 25 mEq/L (ref 19–32)
Creatinine, Ser: 0.85 mg/dL (ref 0.40–1.20)
Glucose, Bld: 107 mg/dL — ABNORMAL HIGH (ref 70–99)
Total Bilirubin: 0.5 mg/dL (ref 0.3–1.2)

## 2010-01-05 LAB — CEA: CEA: 13.4 ng/mL — ABNORMAL HIGH (ref 0.0–5.0)

## 2010-01-11 LAB — PROTIME-INR
INR: 1.6 — ABNORMAL LOW (ref 2.00–3.50)
Protime: 19.2 Seconds — ABNORMAL HIGH (ref 10.6–13.4)

## 2010-01-11 LAB — CBC WITH DIFFERENTIAL/PLATELET
Basophils Absolute: 0 10*3/uL (ref 0.0–0.1)
HCT: 30.3 % — ABNORMAL LOW (ref 34.8–46.6)
HGB: 9.7 g/dL — ABNORMAL LOW (ref 11.6–15.9)
MONO#: 0.5 10*3/uL (ref 0.1–0.9)
NEUT#: 4.7 10*3/uL (ref 1.5–6.5)
NEUT%: 79.6 % — ABNORMAL HIGH (ref 38.4–76.8)
WBC: 6 10*3/uL (ref 3.9–10.3)
lymph#: 0.6 10*3/uL — ABNORMAL LOW (ref 0.9–3.3)

## 2010-01-11 LAB — COMPREHENSIVE METABOLIC PANEL
ALT: 9 U/L (ref 0–35)
AST: 21 U/L (ref 0–37)
Albumin: 3.4 g/dL — ABNORMAL LOW (ref 3.5–5.2)
Alkaline Phosphatase: 64 U/L (ref 39–117)
Calcium: 9.4 mg/dL (ref 8.4–10.5)
Chloride: 101 mEq/L (ref 96–112)
Creatinine, Ser: 0.88 mg/dL (ref 0.40–1.20)
Potassium: 3.2 mEq/L — ABNORMAL LOW (ref 3.5–5.3)

## 2010-01-29 LAB — CBC WITH DIFFERENTIAL/PLATELET
BASO%: 0.5 % (ref 0.0–2.0)
EOS%: 3.1 % (ref 0.0–7.0)
HCT: 32.2 % — ABNORMAL LOW (ref 34.8–46.6)
LYMPH%: 6.5 % — ABNORMAL LOW (ref 14.0–49.7)
MCH: 28.8 pg (ref 25.1–34.0)
MCHC: 32.6 g/dL (ref 31.5–36.0)
NEUT%: 82.7 % — ABNORMAL HIGH (ref 38.4–76.8)
RBC: 3.65 10*6/uL — ABNORMAL LOW (ref 3.70–5.45)
WBC: 5.7 10*3/uL (ref 3.9–10.3)
lymph#: 0.4 10*3/uL — ABNORMAL LOW (ref 0.9–3.3)
nRBC: 0 % (ref 0–0)

## 2010-01-29 LAB — PROTIME-INR: INR: 1.2 — ABNORMAL LOW (ref 2.00–3.50)

## 2010-02-11 ENCOUNTER — Ambulatory Visit: Payer: Self-pay | Admitting: Oncology

## 2010-02-11 LAB — PROTIME-INR: Protime: 18 Seconds — ABNORMAL HIGH (ref 10.6–13.4)

## 2010-02-11 LAB — CBC WITH DIFFERENTIAL/PLATELET
BASO%: 0.4 % (ref 0.0–2.0)
Eosinophils Absolute: 0.1 10*3/uL (ref 0.0–0.5)
LYMPH%: 5.3 % — ABNORMAL LOW (ref 14.0–49.7)
MCHC: 32.4 g/dL (ref 31.5–36.0)
MCV: 89.4 fL (ref 79.5–101.0)
MONO%: 5.1 % (ref 0.0–14.0)
NEUT#: 5.1 10*3/uL (ref 1.5–6.5)
Platelets: 244 10*3/uL (ref 145–400)
RBC: 3.76 10*6/uL (ref 3.70–5.45)
RDW: 18.7 % — ABNORMAL HIGH (ref 11.2–14.5)
WBC: 5.7 10*3/uL (ref 3.9–10.3)
nRBC: 0 % (ref 0–0)

## 2010-02-23 LAB — CBC WITH DIFFERENTIAL/PLATELET
BASO%: 0.5 % (ref 0.0–2.0)
EOS%: 2.4 % (ref 0.0–7.0)
HCT: 37.7 % (ref 34.8–46.6)
LYMPH%: 8.1 % — ABNORMAL LOW (ref 14.0–49.7)
MCH: 29.2 pg (ref 25.1–34.0)
MCHC: 32.6 g/dL (ref 31.5–36.0)
MCV: 89.5 fL (ref 79.5–101.0)
MONO%: 5.6 % (ref 0.0–14.0)
NEUT%: 83.4 % — ABNORMAL HIGH (ref 38.4–76.8)
Platelets: 223 10*3/uL (ref 145–400)
RBC: 4.21 10*6/uL (ref 3.70–5.45)
WBC: 6.3 10*3/uL (ref 3.9–10.3)
nRBC: 0 % (ref 0–0)

## 2010-02-23 LAB — PROTIME-INR

## 2010-03-01 LAB — PROTIME-INR

## 2010-03-01 LAB — PROTHROMBIN TIME: INR: 4.57 — ABNORMAL HIGH (ref <1.50)

## 2010-03-07 ENCOUNTER — Inpatient Hospital Stay (HOSPITAL_COMMUNITY)
Admission: EM | Admit: 2010-03-07 | Discharge: 2010-03-12 | Disposition: A | Discharge status: Hospice | Payer: Self-pay | Source: Home / Self Care | Attending: Internal Medicine | Admitting: Internal Medicine

## 2010-03-09 ENCOUNTER — Encounter (INDEPENDENT_AMBULATORY_CARE_PROVIDER_SITE_OTHER): Payer: Self-pay | Admitting: Internal Medicine

## 2010-03-09 ENCOUNTER — Encounter: Payer: Self-pay | Admitting: Vascular Surgery

## 2010-03-16 ENCOUNTER — Ambulatory Visit: Payer: Self-pay | Admitting: Oncology

## 2010-04-04 ENCOUNTER — Encounter: Payer: Self-pay | Admitting: Urology

## 2010-04-04 ENCOUNTER — Encounter: Payer: Self-pay | Admitting: Oncology

## 2010-04-12 NOTE — Consult Note (Signed)
NAMESAHAR, RYBACK            ACCOUNT NO.:  [id number]  MEDICAL RECORD NO.:  [id number]          PATIENT TYPE:  INP  LOCATION:  1303                         FACILITY:  [facility]  PHYSICIAN:  Drue Second, MD       DATE OF BIRTH:  10-12-1927  DATE OF CONSULTATION:  03/08/2010 DATE OF DISCHARGE:                                CONSULTATION   REASON FOR CONSULTATION:  Metastatic cancer.  HISTORY OF PRESENT ILLNESS:  Ms. Critz is a pleasant 77 year old [place] woman patient of Dr. Darnelle Catalan, with multiple medical problems, latest history consisting of metastatic appendiceal carcinoma, status post laparoscopic resection, with positive margins, in February 2011, also with evidence of perforation.  Documented metastatic disease is through a biopsy of the right abdominal wall mass.  She was briefly on Dilaudid, discontinued due to financial issues.  She was treated with IV 5-FU and leucovorin, continued throughout radiation until she developed fever and neutropenia.  The radiation was aimed at the anterior abdominal mass to reduce pain burden.  She then received capecitabine for 2 additional months, but evidence of progression was noted.  Hospice referral was placed and declined at the end of October. She continues on observation alone.  She was last seen on February 23, 2010, at which time the issue of hospice referral was discussed again. She was to follow up with Dr. Darnelle Catalan on March 30, 2010, at which time she was to make a decision.  However, on March 07, 2010, she was admitted with increasing abdominal pain, especially in the right lower quadrant, poor p.o. intake over the last 3 weeks, some shortness of breath, and initial treatment consisted of IV fluids and IV antibiotics. A chest x-ray on December 25 revealed right large pleural effusion, for which she is to undergo right therapeutic thoracentesis.  Acute abdominal series revealed partial bowel obstruction.  Currently  the patient is n.p.o., and is being managed by [facility]. She is on pain medications and nausea medications, which appears to be adequate at this time.  We were asked to see her in consultation, while her medical issues are being managed.  PAST MEDICAL HISTORY: 1. Metastatic appendiceal cancer as above. 2. History of left inferior ductal carcinoma, 1 of 2 lymph nodes     positive, ER/PR positive, HER-2/neu negative, status post     radiation, tamoxifen from May 2004 through May 2009, on Arimidex     since May 2009 until February 2011 when she developed right lower     extremity DVT. 3. History of right lower extremity DVT, on Coumadin. 4. History of high-grade papillary bladder cancer diagnosed in     November 2008, status post resection, no invasive component, status     post mitomycin C adjuvantly on June 2011. 5. Known right renal mass measuring 2.5 cm, non-hypermetabolic. 6. Schizophrenia. 7. ESRD. 8. Anemia, multifactorial. 9. DNR. 10.Poor nutritional status. 11.Hypertension.  SURGERIES: 1. Status post laparoscopic to open appendectomy April 16, 2009, Dr.     Freida Busman. 2. Status post left mastectomy, partial, with left axillary sentinel     lymph node biopsy, Dr. Maple Hudson. 3. Status post cataract surgery. 4.  Status post total abdominal hysterectomy, bilateral salpingo-     oophorectomy. 5. Status post bladder biopsy, February 2009. 6. Status post resection of a 2.5 cm bladder mass on January 26, 2007, Dr. Brunilda Payor.  ALLERGIES:  NKDA.  MEDICATIONS:  Biotin, vancomycin, Rocephin, Colace, Plendil, prednisone, Kayexalate as directed, Coumadin as directed, Tylenol, Dilaudid, Maalox, Zofran, Percocet, senna, Ultram, Ambien.  REVIEW OF SYSTEMS:  See HPI for significant positives.  The patient has been experiencing increasing abdominal pain, especially in the right lower quadrant, over the last 2 to 3 weeks, worse in the last 2 to 3 days.  The patient has been  losing weight, about 10 pounds over the last month.  She has also poor p.o. intake.  Her nausea is currently controlled with antiemetics and prednisone.  She is constipated at this time due to the partial bowel obstruction.  She denies any fever, chills, or night sweats.  Her shortness of breath is likely related to her pleural effusion and her known anemia and overall status.  However, she denies any abnormal bleeding.  She continues on Coumadin.  No neurological issues.  FAMILY HISTORY:  Noncontributory.  SOCIAL HISTORY:  The patient is widowed.  She has 1 son, Tasia Catchings, who is her main contact.  No tobacco since 1975.  No alcohol history.  PHYSICAL EXAMINATION:  GENERAL:  This is an 77 year old African-American female in no acute distress, alert and oriented x3. VITAL SIGNS:  Blood pressure 147/88, pulse 111, respirations 16, temperature 98.2, pulse oximetry 96% on room air.  Weight 50.6 kg, height 64 inches. HEENT:  Normocephalic, atraumatic.  Sclerae anicteric.  PERRLA.  Oral cavity without thrush or lesions, but dry. LUNGS:  With decreased breath sounds at the right.  No frank crackles, wheezing, or rhonchi. CARDIOVASCULAR:  Regular rate and rhythm without murmurs, rubs, or gallops. ABDOMEN:  Distended, tender in the right lower quadrant, although there are some areas of diffuse tenderness throughout.  In the left, there are known palpable nodules anywhere from 3 to 4 cm in the left abdominal area. EXTREMITIES:  Remarkable for bilateral lower extremity edema, more pronounced on the right, of about 2 to 3+. SKIN:  Remarkable for known hyperpigmentation of the hands bilaterally. NEUROLOGIC:  Nonfocal. MUSCULOSKELETAL:  No focal spinal tenderness.  LABORATORY DATA:  Hemoglobin 9.5, hematocrit 29.5, white count 7.8, platelets 212, MCV 91.6, PTT 40, PT 26.3, INR 2.4.  Sodium 143, potassium 4.4, BUN 58, creatinine 1.49, glucose 111, total bilirubin 1.0, alkaline phosphatase 87, AST  47, ALT 18, total protein 7.0, albumin 2.7, calcium 9.2.  ASSESSMENT AND PLAN:  Ms. Suitt is an 77 year old [place] woman with metastatic appendiceal adenocarcinoma status post laparoscopic resection with positive margins in February 2011, with evidence of perforation at the time, status post Xeloda and then status post 5-FU and leucovorin throughout radiation until she developed fever and neutropenia.  She is status post capecitabine for 2 additional months, with evidence of progression.  At this time, the possibility of hospice referral has been discussed between Dr. Welton Flakes and the patient.  She now agrees with the consultation.  The patient is No Code Blue.  In the interim, agree with the current medical management, including therapeutic thoracentesis and any measures of comfort care.  Thank you very much for allowing [place] the opportunity to participate in the care of this nice patient.  We will follow with you during the hospitalization.     Marlowe Kays, P.A.   ______________________________ Drue Second, MD  SW/MEDQ  D:  03/08/2010  T:  03/08/2010  Job:  [id number]  cc:   Billie Lade, M.D. Fax: [phone]  Lind Guest. August Saucer, M.D. [address] [address] [place] [address]  Lindaann Slough, M.D. Fax: [phone]  Electronically Signed by Marlowe Kays P.A. on 03/09/2010 02:57:14 PM Electronically Signed by Drue Second MD on 04/12/2010 05:41:19 PM

## 2010-04-14 DEATH — deceased

## 2010-05-24 LAB — URINALYSIS, ROUTINE W REFLEX MICROSCOPIC
Nitrite: NEGATIVE
Protein, ur: 30 mg/dL — AB
Urobilinogen, UA: 1 mg/dL (ref 0.0–1.0)

## 2010-05-24 LAB — COMPREHENSIVE METABOLIC PANEL
ALT: 18 U/L (ref 0–35)
AST: 47 U/L — ABNORMAL HIGH (ref 0–37)
Alkaline Phosphatase: 87 U/L (ref 39–117)
CO2: 27 mEq/L (ref 19–32)
GFR calc Af Amer: 34 mL/min — ABNORMAL LOW (ref >60)
Glucose, Bld: 126 mg/dL — ABNORMAL HIGH (ref 70–99)
Potassium: 5.8 mEq/L — ABNORMAL HIGH (ref 3.5–5.1)
Sodium: 143 mEq/L (ref 135–145)
Total Protein: 7 g/dL (ref 6.0–8.3)

## 2010-05-24 LAB — PROTIME-INR
INR: 2.88 — ABNORMAL HIGH (ref 0.00–1.49)
Prothrombin Time: 26.4 seconds — ABNORMAL HIGH (ref 11.6–15.2)

## 2010-05-24 LAB — CBC
HCT: 21.3 % — ABNORMAL LOW (ref 36.0–46.0)
HCT: 26.2 % — ABNORMAL LOW (ref 36.0–46.0)
HCT: 29.5 % — ABNORMAL LOW (ref 36.0–46.0)
Hemoglobin: 6.8 g/dL — CL (ref 12.0–15.0)
Hemoglobin: 9.5 g/dL — ABNORMAL LOW (ref 12.0–15.0)
MCH: 29.5 pg (ref 26.0–34.0)
MCHC: 31.7 g/dL (ref 30.0–36.0)
MCV: 91.6 fL (ref 78.0–100.0)
Platelets: 188 10*3/uL (ref 150–400)
Platelets: 212 10*3/uL (ref 150–400)
RBC: 3.22 MIL/uL — ABNORMAL LOW (ref 3.87–5.11)
RDW: 18.7 % — ABNORMAL HIGH (ref 11.5–15.5)
RDW: 19 % — ABNORMAL HIGH (ref 11.5–15.5)
WBC: 7.3 10*3/uL (ref 4.0–10.5)
WBC: 7.8 10*3/uL (ref 4.0–10.5)
WBC: 9.6 10*3/uL (ref 4.0–10.5)

## 2010-05-24 LAB — BASIC METABOLIC PANEL
BUN: 49 mg/dL — ABNORMAL HIGH (ref 6–23)
CO2: 25 mEq/L (ref 19–32)
Calcium: 9 mg/dL (ref 8.4–10.5)
Calcium: 9.2 mg/dL (ref 8.4–10.5)
Chloride: 109 mEq/L (ref 96–112)
Creatinine, Ser: 1.4 mg/dL — ABNORMAL HIGH (ref 0.4–1.2)
GFR calc Af Amer: 41 mL/min — ABNORMAL LOW (ref >60)
GFR calc Af Amer: 41 mL/min — ABNORMAL LOW (ref >60)
GFR calc non Af Amer: 34 mL/min — ABNORMAL LOW (ref >60)
GFR calc non Af Amer: 36 mL/min — ABNORMAL LOW (ref >60)
Glucose, Bld: 105 mg/dL — ABNORMAL HIGH (ref 70–99)
Glucose, Bld: 111 mg/dL — ABNORMAL HIGH (ref 70–99)
Glucose, Bld: 138 mg/dL — ABNORMAL HIGH (ref 70–99)
Potassium: 3.7 mEq/L (ref 3.5–5.1)
Potassium: 4.4 mEq/L (ref 3.5–5.1)
Potassium: 4.4 mEq/L (ref 3.5–5.1)
Sodium: 143 mEq/L (ref 135–145)
Sodium: 145 mEq/L (ref 135–145)

## 2010-05-24 LAB — IRON AND TIBC
Iron: 23 ug/dL — ABNORMAL LOW (ref 42–135)
Saturation Ratios: 16 % — ABNORMAL LOW (ref 20–55)
TIBC: 144 ug/dL — ABNORMAL LOW (ref 250–470)
UIBC: 121 ug/dL

## 2010-05-24 LAB — CROSSMATCH: Unit division: 0

## 2010-05-24 LAB — PH, BODY FLUID: pH, Fluid: 8

## 2010-05-24 LAB — FERRITIN: Ferritin: 829 ng/mL — ABNORMAL HIGH (ref 10–291)

## 2010-05-24 LAB — URINE CULTURE: Culture  Setup Time: 201112261149

## 2010-05-24 LAB — FOLATE: Folate: 18.8 ng/mL

## 2010-05-24 LAB — PROTEIN, BODY FLUID

## 2010-05-24 LAB — DIFFERENTIAL
Basophils Relative: 0 % (ref 0–1)
Eosinophils Absolute: 0 10*3/uL (ref 0.0–0.7)
Eosinophils Relative: 0 % (ref 0–5)
Lymphs Abs: 0.3 10*3/uL — ABNORMAL LOW (ref 0.7–4.0)
Monocytes Absolute: 0.5 10*3/uL (ref 0.1–1.0)
Neutrophils Relative %: 92 % — ABNORMAL HIGH (ref 43–77)

## 2010-05-24 LAB — LACTATE DEHYDROGENASE, PLEURAL OR PERITONEAL FLUID: LD, Fluid: 1116 U/L — ABNORMAL HIGH (ref 3–23)

## 2010-06-01 LAB — CBC
Platelets: 157 10*3/uL (ref 150–400)
RDW: 16.6 % — ABNORMAL HIGH (ref 11.5–15.5)
WBC: 2.9 10*3/uL — ABNORMAL LOW (ref 4.0–10.5)

## 2010-06-01 LAB — APTT: aPTT: 33 seconds (ref 24–37)

## 2010-06-01 LAB — PROTIME-INR: INR: 1.07 (ref 0.00–1.49)

## 2010-06-02 LAB — CBC
HCT: 26.3 % — ABNORMAL LOW (ref 36.0–46.0)
HCT: 26.5 % — ABNORMAL LOW (ref 36.0–46.0)
Hemoglobin: 8.8 g/dL — ABNORMAL LOW (ref 12.0–15.0)
MCHC: 33.8 g/dL (ref 30.0–36.0)
MCV: 88.3 fL (ref 78.0–100.0)
MCV: 89.2 fL (ref 78.0–100.0)
Platelets: 121 10*3/uL — ABNORMAL LOW (ref 150–400)
Platelets: 124 10*3/uL — ABNORMAL LOW (ref 150–400)
Platelets: 151 10*3/uL (ref 150–400)
RDW: 13.8 % (ref 11.5–15.5)
RDW: 15.1 % (ref 11.5–15.5)
WBC: 4.3 10*3/uL (ref 4.0–10.5)
WBC: 5 10*3/uL (ref 4.0–10.5)
WBC: 5.6 10*3/uL (ref 4.0–10.5)

## 2010-06-02 LAB — BASIC METABOLIC PANEL
BUN: 23 mg/dL (ref 6–23)
BUN: 26 mg/dL — ABNORMAL HIGH (ref 6–23)
BUN: 30 mg/dL — ABNORMAL HIGH (ref 6–23)
BUN: 33 mg/dL — ABNORMAL HIGH (ref 6–23)
BUN: 34 mg/dL — ABNORMAL HIGH (ref 6–23)
CO2: 23 mEq/L (ref 19–32)
CO2: 24 mEq/L (ref 19–32)
Calcium: 7.7 mg/dL — ABNORMAL LOW (ref 8.4–10.5)
Calcium: 7.8 mg/dL — ABNORMAL LOW (ref 8.4–10.5)
Calcium: 8.2 mg/dL — ABNORMAL LOW (ref 8.4–10.5)
Chloride: 100 mEq/L (ref 96–112)
Chloride: 115 mEq/L — ABNORMAL HIGH (ref 96–112)
Creatinine, Ser: 2.53 mg/dL — ABNORMAL HIGH (ref 0.4–1.2)
Creatinine, Ser: 3.31 mg/dL — ABNORMAL HIGH (ref 0.4–1.2)
Creatinine, Ser: 3.48 mg/dL — ABNORMAL HIGH (ref 0.4–1.2)
Creatinine, Ser: 3.68 mg/dL — ABNORMAL HIGH (ref 0.4–1.2)
GFR calc Af Amer: 14 mL/min — ABNORMAL LOW (ref >60)
GFR calc non Af Amer: 13 mL/min — ABNORMAL LOW (ref >60)
GFR calc non Af Amer: 13 mL/min — ABNORMAL LOW (ref >60)
GFR calc non Af Amer: 17 mL/min — ABNORMAL LOW (ref >60)
Glucose, Bld: 101 mg/dL — ABNORMAL HIGH (ref 70–99)
Glucose, Bld: 108 mg/dL — ABNORMAL HIGH (ref 70–99)
Glucose, Bld: 114 mg/dL — ABNORMAL HIGH (ref 70–99)
Glucose, Bld: 123 mg/dL — ABNORMAL HIGH (ref 70–99)
Glucose, Bld: 184 mg/dL — ABNORMAL HIGH (ref 70–99)
Potassium: 4.2 mEq/L (ref 3.5–5.1)
Sodium: 138 mEq/L (ref 135–145)
Sodium: 141 mEq/L (ref 135–145)

## 2010-06-02 LAB — RENAL FUNCTION PANEL
BUN: 18 mg/dL (ref 6–23)
CO2: 23 mEq/L (ref 19–32)
Calcium: 7.5 mg/dL — ABNORMAL LOW (ref 8.4–10.5)
Creatinine, Ser: 2.29 mg/dL — ABNORMAL HIGH (ref 0.4–1.2)
Glucose, Bld: 101 mg/dL — ABNORMAL HIGH (ref 70–99)
Phosphorus: 3 mg/dL (ref 2.3–4.6)
Sodium: 142 mEq/L (ref 135–145)

## 2010-06-02 LAB — DIFFERENTIAL
Basophils Absolute: 0 10*3/uL (ref 0.0–0.1)
Lymphocytes Relative: 15 % (ref 12–46)
Neutro Abs: 3.3 10*3/uL (ref 1.7–7.7)
Neutrophils Relative %: 76 % (ref 43–77)

## 2010-06-02 LAB — RETICULOCYTES
Retic Count, Absolute: 16.7 10*3/uL — ABNORMAL LOW (ref 19.0–186.0)
Retic Ct Pct: 0.5 % (ref 0.4–3.1)

## 2010-06-02 LAB — IRON AND TIBC
Iron: 18 ug/dL — ABNORMAL LOW (ref 42–135)
TIBC: 136 ug/dL — ABNORMAL LOW (ref 250–470)
UIBC: 118 ug/dL

## 2010-06-02 LAB — PROTEIN, URINE, RANDOM: Total Protein, Urine: 6 mg/dL

## 2010-06-02 LAB — URINALYSIS, ROUTINE W REFLEX MICROSCOPIC
Bilirubin Urine: NEGATIVE
Glucose, UA: NEGATIVE mg/dL
Protein, ur: NEGATIVE mg/dL
Urobilinogen, UA: 0.2 mg/dL (ref 0.0–1.0)

## 2010-06-02 LAB — PROTIME-INR: INR: 1.26 (ref 0.00–1.49)

## 2010-06-02 LAB — UIFE/LIGHT CHAINS/TP QN, 24-HR UR
Albumin, U: DETECTED
Alpha 1, Urine: DETECTED — AB
Alpha 2, Urine: DETECTED — AB
Beta, Urine: DETECTED — AB
Gamma Globulin, Urine: DETECTED — AB
Total Protein, Urine: 19.2 mg/dL

## 2010-06-02 LAB — URINE MICROSCOPIC-ADD ON

## 2010-06-02 LAB — COMPREHENSIVE METABOLIC PANEL
Albumin: 2.1 g/dL — ABNORMAL LOW (ref 3.5–5.2)
BUN: 13 mg/dL (ref 6–23)
Calcium: 7.5 mg/dL — ABNORMAL LOW (ref 8.4–10.5)
Chloride: 112 mEq/L (ref 96–112)
Creatinine, Ser: 1.94 mg/dL — ABNORMAL HIGH (ref 0.4–1.2)
GFR calc non Af Amer: 25 mL/min — ABNORMAL LOW (ref >60)
Total Bilirubin: 0.3 mg/dL (ref 0.3–1.2)

## 2010-06-02 LAB — URINE CULTURE
Colony Count: 2000
Special Requests: NEGATIVE

## 2010-06-02 LAB — PROTEIN ELECTROPHORESIS, SERUM
Albumin ELP: 46.9 % — ABNORMAL LOW (ref 55.8–66.1)
Alpha-1-Globulin: 9.5 % — ABNORMAL HIGH (ref 2.9–4.9)
Beta 2: 5.8 % (ref 3.2–6.5)
Beta Globulin: 6.3 % (ref 4.7–7.2)
Gamma Globulin: 20.3 % — ABNORMAL HIGH (ref 11.1–18.8)

## 2010-06-02 LAB — CEA: CEA: 2.8 ng/mL (ref 0.0–5.0)

## 2010-06-02 LAB — IMMUNOFIXATION, URINE

## 2010-06-06 LAB — GLUCOSE, CAPILLARY: Glucose-Capillary: 103 mg/dL — ABNORMAL HIGH (ref 70–99)

## 2010-06-16 LAB — AMYLASE
Amylase: 271 U/L — ABNORMAL HIGH (ref 27–131)
Amylase: 89 U/L (ref 27–131)

## 2010-06-16 LAB — CBC
HCT: 28.7 % — ABNORMAL LOW (ref 36.0–46.0)
HCT: 26.2 % — ABNORMAL LOW (ref 36.0–46.0)
HCT: 26.5 % — ABNORMAL LOW (ref 36.0–46.0)
HCT: 28.4 % — ABNORMAL LOW (ref 36.0–46.0)
HCT: 29.7 % — ABNORMAL LOW (ref 36.0–46.0)
HCT: 29.9 % — ABNORMAL LOW (ref 36.0–46.0)
HCT: 30.2 % — ABNORMAL LOW (ref 36.0–46.0)
HCT: 31.2 % — ABNORMAL LOW (ref 36.0–46.0)
Hemoglobin: 9.4 g/dL — ABNORMAL LOW (ref 12.0–15.0)
Hemoglobin: 9.5 g/dL — ABNORMAL LOW (ref 12.0–15.0)
Hemoglobin: 10 g/dL — ABNORMAL LOW (ref 12.0–15.0)
Hemoglobin: 10.7 g/dL — ABNORMAL LOW (ref 12.0–15.0)
Hemoglobin: 11.8 g/dL — ABNORMAL LOW (ref 12.0–15.0)
Hemoglobin: 9.1 g/dL — ABNORMAL LOW (ref 12.0–15.0)
Hemoglobin: 9.4 g/dL — ABNORMAL LOW (ref 12.0–15.0)
MCHC: 32.8 g/dL (ref 30.0–36.0)
MCHC: 33 g/dL (ref 30.0–36.0)
MCHC: 33.5 g/dL (ref 30.0–36.0)
MCHC: 33.7 g/dL (ref 30.0–36.0)
MCHC: 33.7 g/dL (ref 30.0–36.0)
MCHC: 34.3 g/dL (ref 30.0–36.0)
MCV: 89.7 fL (ref 78.0–100.0)
MCV: 88.4 fL (ref 78.0–100.0)
MCV: 88.5 fL (ref 78.0–100.0)
MCV: 88.5 fL (ref 78.0–100.0)
MCV: 88.7 fL (ref 78.0–100.0)
MCV: 89.1 fL (ref 78.0–100.0)
MCV: 90.1 fL (ref 78.0–100.0)
Platelets: 143 10*3/uL — ABNORMAL LOW (ref 150–400)
Platelets: 180 10*3/uL (ref 150–400)
Platelets: 254 10*3/uL (ref 150–400)
Platelets: 305 10*3/uL (ref 150–400)
Platelets: 343 10*3/uL (ref 150–400)
RBC: 3.21 MIL/uL — ABNORMAL LOW (ref 3.87–5.11)
RBC: 2.95 MIL/uL — ABNORMAL LOW (ref 3.87–5.11)
RBC: 3 MIL/uL — ABNORMAL LOW (ref 3.87–5.11)
RBC: 3.35 MIL/uL — ABNORMAL LOW (ref 3.87–5.11)
RBC: 3.52 MIL/uL — ABNORMAL LOW (ref 3.87–5.11)
RDW: 15.1 % (ref 11.5–15.5)
RDW: 14.2 % (ref 11.5–15.5)
RDW: 14.3 % (ref 11.5–15.5)
RDW: 14.6 % (ref 11.5–15.5)
RDW: 15.4 % (ref 11.5–15.5)
RDW: 16.1 % — ABNORMAL HIGH (ref 11.5–15.5)
WBC: 7.1 10*3/uL (ref 4.0–10.5)
WBC: 13.8 10*3/uL — ABNORMAL HIGH (ref 4.0–10.5)
WBC: 15.6 10*3/uL — ABNORMAL HIGH (ref 4.0–10.5)
WBC: 6.6 10*3/uL (ref 4.0–10.5)
WBC: 7.1 10*3/uL (ref 4.0–10.5)
WBC: 8.9 10*3/uL (ref 4.0–10.5)

## 2010-06-16 LAB — IRON AND TIBC
Iron: 34 ug/dL — ABNORMAL LOW (ref 42–135)
TIBC: 155 ug/dL — ABNORMAL LOW (ref 250–470)

## 2010-06-16 LAB — COMPREHENSIVE METABOLIC PANEL
ALT: 17 U/L (ref 0–35)
ALT: 18 U/L (ref 0–35)
ALT: 30 U/L (ref 0–35)
AST: 19 U/L (ref 0–37)
AST: 24 U/L (ref 0–37)
Albumin: 2.1 g/dL — ABNORMAL LOW (ref 3.5–5.2)
Albumin: 2.1 g/dL — ABNORMAL LOW (ref 3.5–5.2)
Albumin: 2.4 g/dL — ABNORMAL LOW (ref 3.5–5.2)
Albumin: 3.1 g/dL — ABNORMAL LOW (ref 3.5–5.2)
Alkaline Phosphatase: 32 U/L — ABNORMAL LOW (ref 39–117)
Alkaline Phosphatase: 38 U/L — ABNORMAL LOW (ref 39–117)
Alkaline Phosphatase: 42 U/L (ref 39–117)
Alkaline Phosphatase: 47 U/L (ref 39–117)
Alkaline Phosphatase: 55 U/L (ref 39–117)
BUN: 4 mg/dL — ABNORMAL LOW (ref 6–23)
BUN: 14 mg/dL (ref 6–23)
BUN: 19 mg/dL (ref 6–23)
BUN: 31 mg/dL — ABNORMAL HIGH (ref 6–23)
BUN: 6 mg/dL (ref 6–23)
BUN: 8 mg/dL (ref 6–23)
BUN: 9 mg/dL (ref 6–23)
CO2: 21 mEq/L (ref 19–32)
CO2: 23 mEq/L (ref 19–32)
CO2: 24 mEq/L (ref 19–32)
CO2: 24 mEq/L (ref 19–32)
CO2: 25 mEq/L (ref 19–32)
Calcium: 8.3 mg/dL — ABNORMAL LOW (ref 8.4–10.5)
Calcium: 8 mg/dL — ABNORMAL LOW (ref 8.4–10.5)
Calcium: 8.4 mg/dL (ref 8.4–10.5)
Chloride: 107 mEq/L (ref 96–112)
Chloride: 108 mEq/L (ref 96–112)
Chloride: 109 mEq/L (ref 96–112)
Chloride: 99 mEq/L (ref 96–112)
Creatinine, Ser: 1.25 mg/dL — ABNORMAL HIGH (ref 0.4–1.2)
Creatinine, Ser: 1.51 mg/dL — ABNORMAL HIGH (ref 0.4–1.2)
Creatinine, Ser: 2.04 mg/dL — ABNORMAL HIGH (ref 0.4–1.2)
GFR calc Af Amer: 23 mL/min — ABNORMAL LOW (ref >60)
GFR calc Af Amer: 56 mL/min — ABNORMAL LOW (ref >60)
GFR calc non Af Amer: 23 mL/min — ABNORMAL LOW (ref >60)
GFR calc non Af Amer: 33 mL/min — ABNORMAL LOW (ref >60)
GFR calc non Af Amer: 41 mL/min — ABNORMAL LOW (ref >60)
GFR calc non Af Amer: 43 mL/min — ABNORMAL LOW (ref >60)
GFR calc non Af Amer: 46 mL/min — ABNORMAL LOW (ref >60)
Glucose, Bld: 101 mg/dL — ABNORMAL HIGH (ref 70–99)
Glucose, Bld: 112 mg/dL — ABNORMAL HIGH (ref 70–99)
Glucose, Bld: 119 mg/dL — ABNORMAL HIGH (ref 70–99)
Glucose, Bld: 122 mg/dL — ABNORMAL HIGH (ref 70–99)
Glucose, Bld: 135 mg/dL — ABNORMAL HIGH (ref 70–99)
Glucose, Bld: 190 mg/dL — ABNORMAL HIGH (ref 70–99)
Potassium: 3.4 mEq/L — ABNORMAL LOW (ref 3.5–5.1)
Potassium: 3.4 mEq/L — ABNORMAL LOW (ref 3.5–5.1)
Potassium: 3.9 mEq/L (ref 3.5–5.1)
Potassium: 4.3 mEq/L (ref 3.5–5.1)
Potassium: 5 mEq/L (ref 3.5–5.1)
Sodium: 136 mEq/L (ref 135–145)
Sodium: 136 mEq/L (ref 135–145)
Sodium: 137 mEq/L (ref 135–145)
Total Bilirubin: 0.3 mg/dL (ref 0.3–1.2)
Total Bilirubin: 0.4 mg/dL (ref 0.3–1.2)
Total Bilirubin: 0.6 mg/dL (ref 0.3–1.2)
Total Bilirubin: 0.6 mg/dL (ref 0.3–1.2)
Total Protein: 5.2 g/dL — ABNORMAL LOW (ref 6.0–8.3)
Total Protein: 5.6 g/dL — ABNORMAL LOW (ref 6.0–8.3)
Total Protein: 7.5 g/dL (ref 6.0–8.3)

## 2010-06-16 LAB — DIFFERENTIAL
Basophils Relative: 0 % (ref 0–1)
Eosinophils Absolute: 0.1 10*3/uL (ref 0.0–0.7)
Eosinophils Absolute: 0 10*3/uL (ref 0.0–0.7)
Eosinophils Relative: 0 % (ref 0–5)
Lymphs Abs: 0.8 10*3/uL (ref 0.7–4.0)
Monocytes Absolute: 0.4 10*3/uL (ref 0.1–1.0)
Monocytes Relative: 8 % (ref 3–12)
Monocytes Relative: 5 % (ref 3–12)
Neutrophils Relative %: 79 % — ABNORMAL HIGH (ref 43–77)
Neutrophils Relative %: 91 % — ABNORMAL HIGH (ref 43–77)

## 2010-06-16 LAB — CULTURE, BLOOD (ROUTINE X 2): Culture: NO GROWTH

## 2010-06-16 LAB — BASIC METABOLIC PANEL
BUN: 9 mg/dL (ref 6–23)
CO2: 25 mEq/L (ref 19–32)
Calcium: 8.2 mg/dL — ABNORMAL LOW (ref 8.4–10.5)
Creatinine, Ser: 1.17 mg/dL (ref 0.4–1.2)
GFR calc non Af Amer: 45 mL/min — ABNORMAL LOW (ref >60)
Glucose, Bld: 104 mg/dL — ABNORMAL HIGH (ref 70–99)
Glucose, Bld: 120 mg/dL — ABNORMAL HIGH (ref 70–99)
Potassium: 4 mEq/L (ref 3.5–5.1)
Sodium: 136 mEq/L (ref 135–145)

## 2010-06-16 LAB — CROSSMATCH

## 2010-06-16 LAB — STOOL CULTURE

## 2010-06-16 LAB — CLOSTRIDIUM DIFFICILE EIA: C difficile Toxins A+B, EIA: NEGATIVE

## 2010-06-16 LAB — ANA: Anti Nuclear Antibody(ANA): NEGATIVE

## 2010-06-16 LAB — URINALYSIS, ROUTINE W REFLEX MICROSCOPIC
Bilirubin Urine: NEGATIVE
Ketones, ur: NEGATIVE mg/dL
Nitrite: NEGATIVE
Urobilinogen, UA: 0.2 mg/dL (ref 0.0–1.0)

## 2010-06-16 LAB — LIPASE, BLOOD: Lipase: 11 U/L (ref 11–59)

## 2010-06-16 LAB — FECAL LACTOFERRIN, QUANT: Fecal Lactoferrin: POSITIVE

## 2010-06-16 LAB — SEDIMENTATION RATE
Sed Rate: 50 mm/hr — ABNORMAL HIGH (ref 0–22)
Sed Rate: 75 mm/hr — ABNORMAL HIGH (ref 0–22)

## 2010-06-16 LAB — ANAEROBIC CULTURE

## 2010-06-16 LAB — CULTURE, ROUTINE-ABSCESS: Culture: NO GROWTH

## 2010-06-16 LAB — URINE CULTURE
Culture: NO GROWTH
Special Requests: NEGATIVE

## 2010-06-16 LAB — OVA AND PARASITE EXAMINATION

## 2010-06-16 LAB — URINE MICROSCOPIC-ADD ON

## 2010-06-18 LAB — CREATININE, SERUM
Creatinine, Ser: 1.75 mg/dL — ABNORMAL HIGH (ref 0.4–1.2)
GFR calc Af Amer: 34 mL/min — ABNORMAL LOW (ref >60)
GFR calc non Af Amer: 28 mL/min — ABNORMAL LOW (ref >60)

## 2010-06-29 LAB — BASIC METABOLIC PANEL
BUN: 21 mg/dL (ref 6–23)
GFR calc non Af Amer: 35 mL/min — ABNORMAL LOW (ref >60)
Potassium: 3.4 mEq/L — ABNORMAL LOW (ref 3.5–5.1)

## 2010-06-29 LAB — CBC
MCHC: 33 g/dL (ref 30.0–36.0)
RBC: 3.82 MIL/uL — ABNORMAL LOW (ref 3.87–5.11)
WBC: 3.9 10*3/uL — ABNORMAL LOW (ref 4.0–10.5)

## 2010-06-29 LAB — PROTIME-INR
INR: 1.1 (ref 0.00–1.49)
Prothrombin Time: 14.2 seconds (ref 11.6–15.2)

## 2010-07-27 NOTE — Op Note (Signed)
NAMEMILANIE, ROSENFIELD            ACCOUNT NO.:  [id number]   MEDICAL RECORD NO.:  [id number]          PATIENT TYPE:  AMB   LOCATION:  [facility]                         FACILITY:  [facility]   PHYSICIAN:  Lindaann Slough, M.D.  DATE OF BIRTH:  21-Oct-1927   DATE OF PROCEDURE:  01/26/2007  DATE OF DISCHARGE:                               OPERATIVE REPORT   ATTENDING:  Lindaann Slough, M.D.   RESIDENT:  Dr. Allena Katz   PROCEDURES:  1. Pancystourethroscopy.  2. Resection of 2.5 cm bladder dome mass.  3. Instillation of mitomycin C.   PREOPERATIVE DIAGNOSES:  Bladder dome mass.   POSTOPERATIVE DIAGNOSES:  Bladder dome mass, status post resection.   INDICATIONS:  This is a 77 year old female, who was seen in clinic and  found to have a dome bladder mass.  Upper tract imaging was negative and  she presents today, electively, for transurethral resection of her  bladder tumor.   PROCEDURE IN DETAIL:  After preoperative time-out was performed, after  the successful induction of anesthetic, and receiving preoperative  antibiotics, the patient was prepped and draped in the usual sterile  fashion.  When placed in lithotomy position, all pressure points were  padded appropriately.  A resectoscope was inserted into the patient's  urethra and bladder.  The patient's urethra appeared normal.  There was  no evidence of stricture, foreign body or tumor.  In the patient's  bladder, we identified a dome bladder tumor, approximately 2.5 cm in  dimension.  There were no other tumors located anywhere else in the  bladder.  The rest of the bladder appeared normal without evidence of  tumor, stones, foreign body.  Both ureteral orifices were seen,  effluxing clear urine.   At this point, using the loop without current, we picked off the bladder  tumor down to its base.  Once this was done, the specimens were  collected using a Toomey syringe.  Then, using the bladder loop, we  resected the base of the tumor and  sent this as a separate specimen.   Then we turned our attention to coagulating any visible bleeders and, as  there was no evident bleeding, this ended this portion of the procedure.   The patient's bladder was emptied and clear urine was seen effluxing,  this with an #18 [place] Foley catheter.  Then a 40 mg in 40 mL solution  of mitomycin C was instilled and a cap was placed on the catheter.   At this point, the procedure was ended.  Please note Dr. Brunilda Payor was  present throughout the entirety of the case.   ESTIMATED BLOOD LOSS:  Minimal.   URINE OUTPUT:  None recorded.   DRAINS:  Foley catheter #18 [place].   SPECIMENS:  Bladder tumor, bladder base sent separately.   DISPOSITION:  The patient is stable for the PACU.  She will have the  mitomycin drained from her bladder in approximately one hour's time.  She will go home with the Foley catheter and return on Monday to see Dr.  Brunilda Payor for a catheter removal.     ______________________________  Dr. Allena Katz  Lindaann Slough, M.D.  Electronically Signed    Hadley Pen  D:  01/26/2007  T:  01/26/2007  Job:  [id number]

## 2010-07-27 NOTE — Op Note (Signed)
Bethany Dixon, Bethany Dixon            ACCOUNT NO.:  [id number]   MEDICAL RECORD NO.:  [id number]          PATIENT TYPE:  AMB   LOCATION:  [facility]                         FACILITY:  [facility]   PHYSICIAN:  Lindaann Slough, M.D.  DATE OF BIRTH:  [birth year]   DATE OF PROCEDURE:  05/04/2007  DATE OF DISCHARGE:                               OPERATIVE REPORT   PREOPERATIVE DIAGNOSIS:  Rule out recurrent bladder tumor.   POSTPROCEDURE DIAGNOSIS:  No recurrent bladder tumor.   PROCEDURE:  Cystoscopy, random bladder biopsy, and biopsy of previous  bladder tumor site.   SURGEON:  Danae Chen, M.D.   ANESTHESIA:  General.   INDICATIONS:  The patient is a 77 year old female who had TURBT on  January 26, 2007.  Pathology report showed high grade TCC with no  invasion.  She has been voiding well.  She has no hematuria.  She is  scheduled today for cystoscopy and biopsy of the previous TURBT site and  random bladder biopsy.   PROCEDURE IN DETAIL:  Under general anesthesia, the patient was prepped  and draped and placed in the dorsal lithotomy position.  The patient was  identified with the wrist band and time out was done.  Then, a  panendoscope was inserted in the bladder.  There is some necrotic tissue  at the site of the previous resection site.  There is no evidence of  tumor in the bladder.  The ureteral orifices are in normal position and  shape with clear efflux.  Cystoscopy was done with both the foroblique  and right angle lenses.  Then, a biopsy of the previous resection site  was done and sent in a separate container.  Then, random bladder biopsy  was done.  Then, the areas of biopsy were fulgurated with the Bugbee  electrode.  There was no evidence of bleeding at the end of the  procedure.  The bladder was then irrigated with normal saline and  bladder washings were sent for cytology.  The patient tolerated the  procedure well and left the OR in satisfactory condition to the post  anesthesia care unit.      Lindaann Slough, M.D.  Electronically Signed     MN/MEDQ  D:  05/04/2007  T:  05/04/2007  Job:  [id number]   cc:   Minerva Areola L. August Saucer, M.D.  Fax: [phone]

## 2010-07-30 NOTE — Procedures (Signed)
[place]  Patient:    Bethany Dixon                    MRN: [id number] Proc. Date: 02/25/99 Adm. Date:  [id number] Attending:  Charna Elizabeth CC:         Lind Guest. August Saucer, M.D.                           Procedure Report  DATE OF BIRTH:  1928-02-08  REFERRING PHYSICIAN:  Dr. Minerva Areola L. Dean  PROCEDURE PERFORMED:  Flexible sigmoidoscopy.  ENDOSCOPIST:  Anselmo Rod, M.D.  INSTRUMENT USED:  Olympus video colonoscope.  INDICATIONS:  Screening flexible sigmoidoscopy being performed in a 77 year old  black female.  Rule out polyps, AVMs, masses, hemorrhoids, etc.  PREPROCEDURE PREPARATION:  Informed consent was procured from the patient.  The  patient was fasted for 8 hours prior to the procedure.  PREPROCEDURE PHYSICAL:  Patient has stable vital signs.  NECK: Supple.  CHEST:  Clear to auscultation. S1, S2 regular.  ABDOMEN:  Soft with normal abdominal bowel sounds.  DESCRIPTION OF PROCEDURE:  The patient was placed in left lateral decubitus position, no sedation was used.  Once the patient was adequately positioned, the Olympus video colonoscope was advanced from the rectum to 70 cm without difficulty. There was some residual stool in the colon, but no masses, polyps, erosions or ulcerations or diverticular disease was seen.  No hemorrhoids at present.  The patient tolerated the procedure well without complication.  IMPRESSION:  Normal flexible sigmoidoscopy up to 70 cm.  RECOMMENDATIONS:  The patient has been advised to have a repeat screening examination in the next five years or earlier if she were to develop any problems in the interim. DD:  02/25/99 TD:  02/26/99 Job: [id number]

## 2010-12-03 LAB — POCT I-STAT 4, (NA,K, GLUC, HGB,HCT)
Glucose, Bld: 116 — ABNORMAL HIGH
Operator id: 268271

## 2010-12-21 LAB — I-STAT 8, (EC8 V) (CONVERTED LAB)
BUN: 19
Bicarbonate: 31.8 — ABNORMAL HIGH
Glucose, Bld: 103 — ABNORMAL HIGH
HCT: 40
Hemoglobin: 13.6
Operator id: 268271
Potassium: 4.4
Sodium: 140

## 2011-02-28 IMAGING — US US ABDOMEN COMPLETE
1 series · 13 of 25 positions shown · non-contrast
Comparison: 11/20/2007

CLINICAL DATA: Increased amylase

COMPLETE ABDOMINAL ULTRASOUND

[Series 1: us abdomen complete · 0.24mm/px · 13 of 87 slices shown]
[im 1/87]
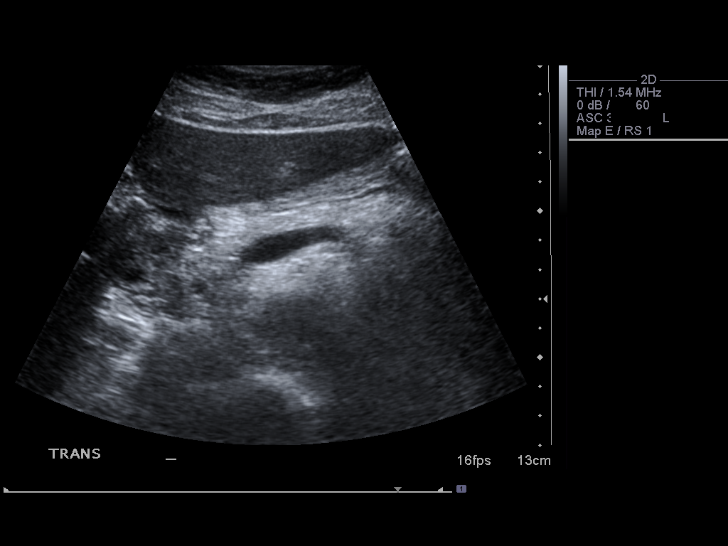
[im 8/87]
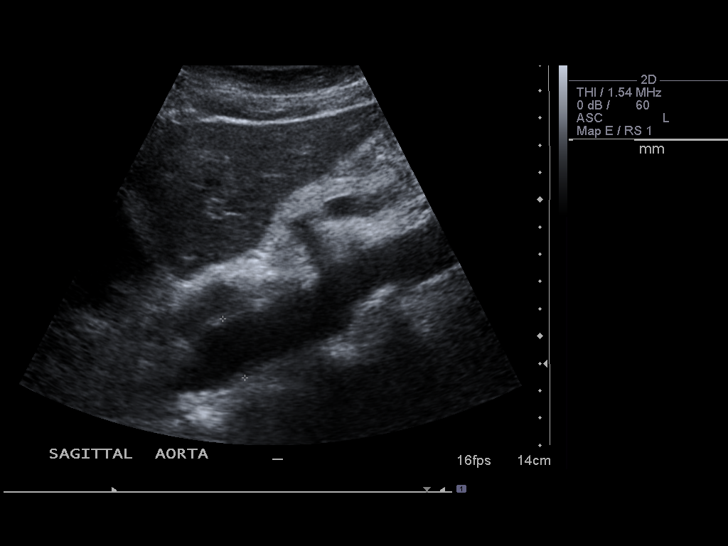
[im 15/87]
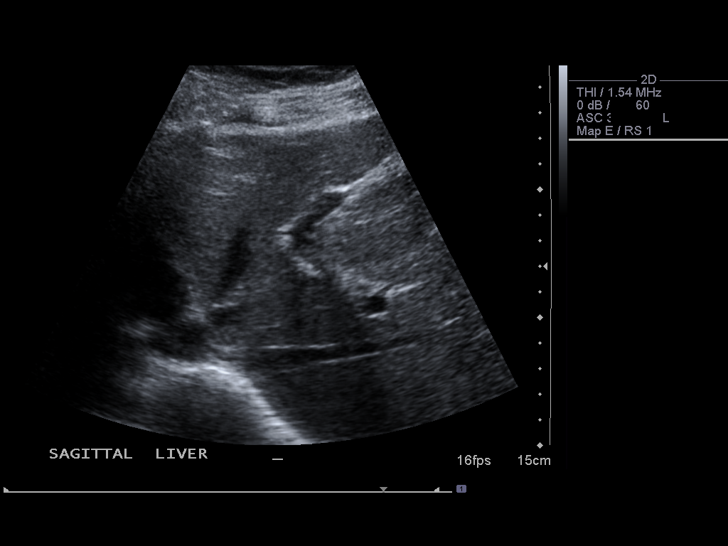
[im 22/87]
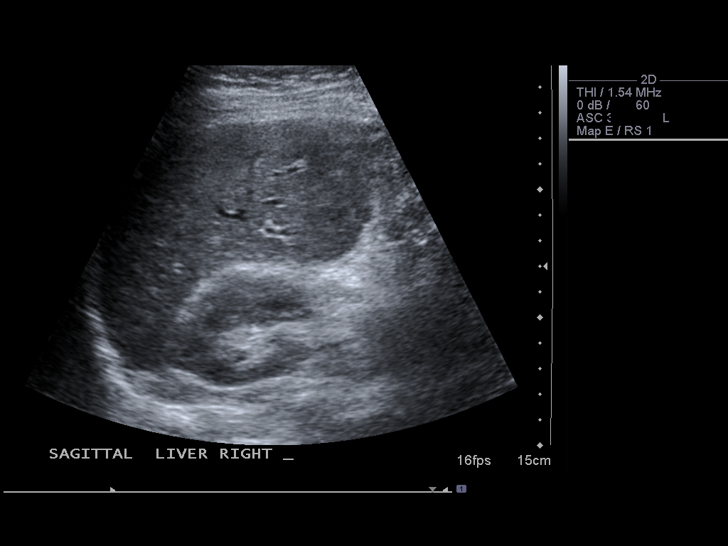
[im 29/87]
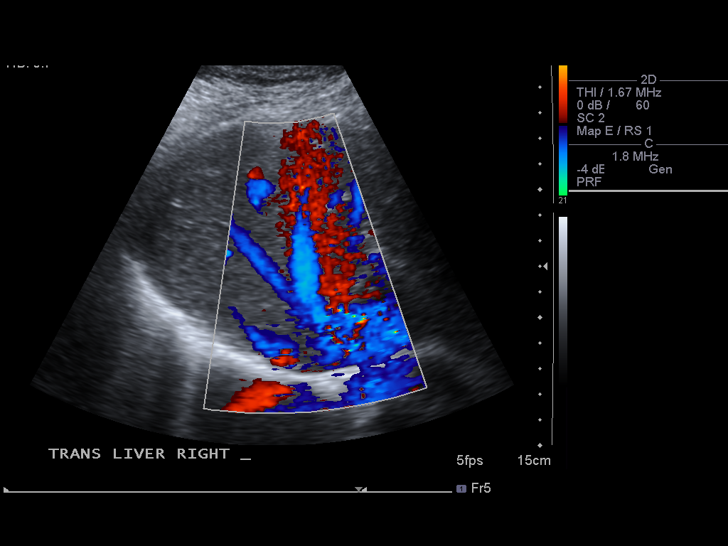
[im 36/87]
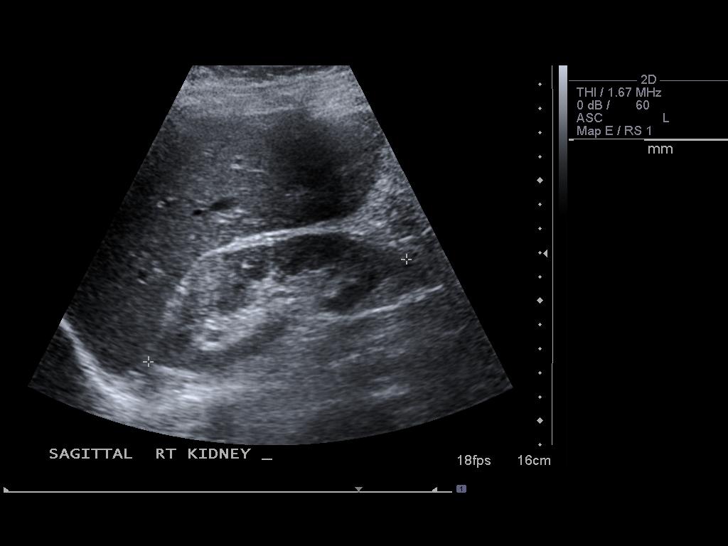
[im 44/87]
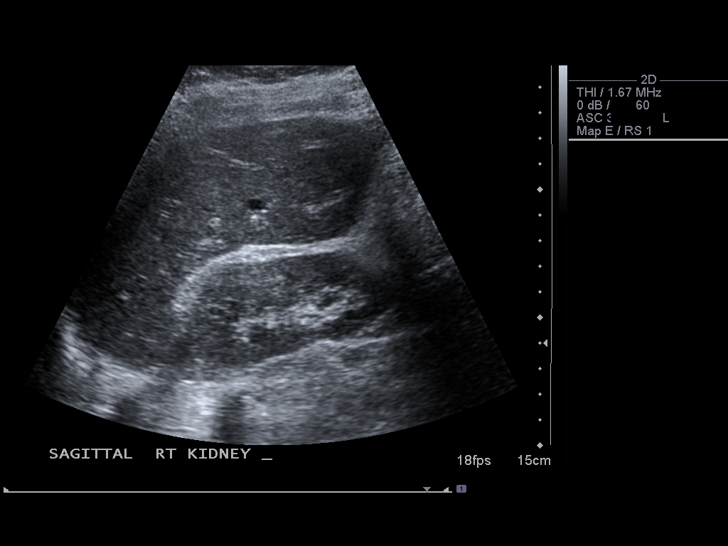
[im 51/87]
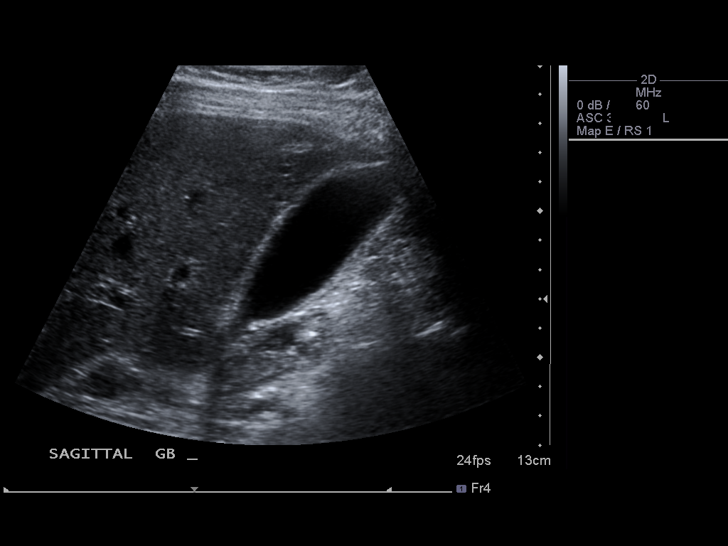
[im 58/87]
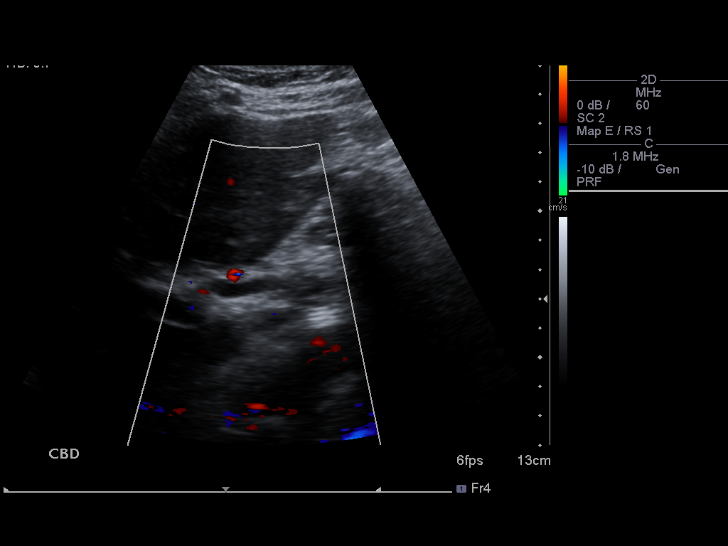
[im 65/87]
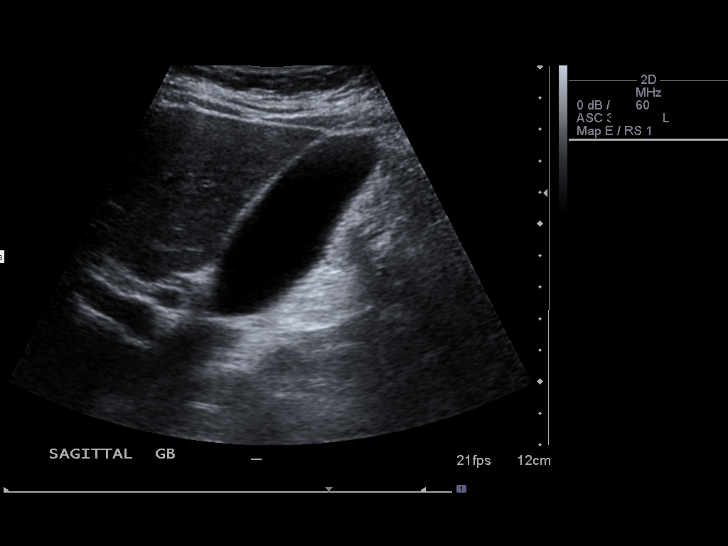
[im 72/87]
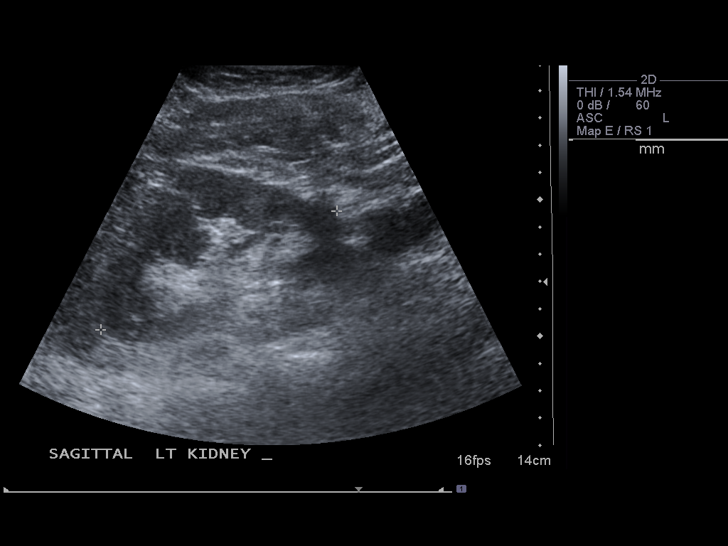
[im 79/87]
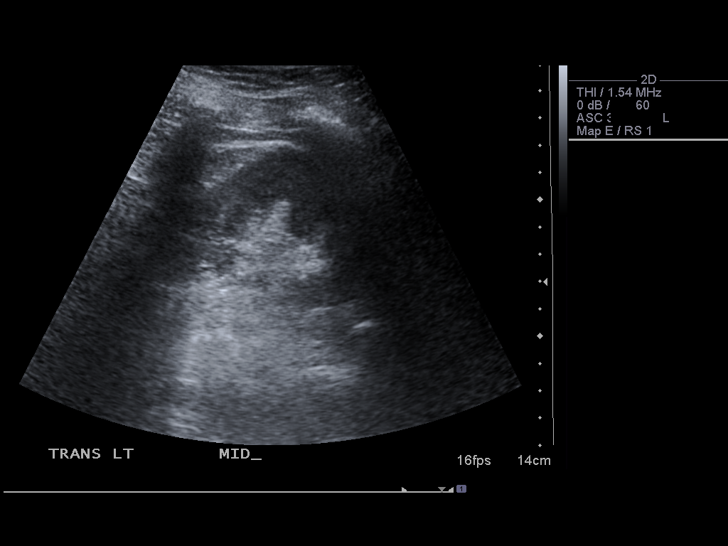
[im 87/87]
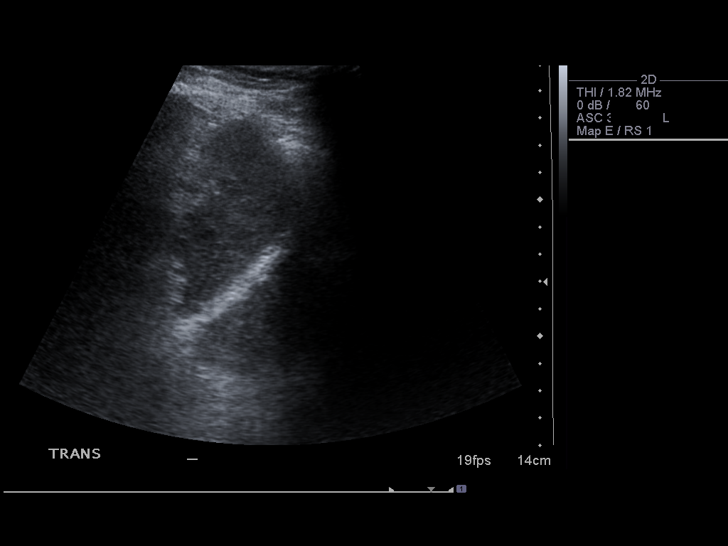

[13 of 25 positions shown; findings below may reference images not displayed]

FINDINGS: Gallbladder:  No gallstones, gallbladder wall thickening, or
pericholecystic fluid. There is no sonographic Murphy's sign.

Common bile duct:  Within normal limits measures 4.9 mm in
diameter.

Liver:  No focal lesion identified.  Within normal limits in
parenchymal echogenicity.

IVC:  Not visualized due to bowel gas.

Pancreas:  No focal abnormality seen. Pancreatic tail is not
visualized due to bowel gas

Spleen:  Measures 4.7 cm in length.  Normal echogenicity.

Right Kidney:  Right kidney measures 11.6 cm in length.  Again
identified a hypoechoic mid pole mass measures 2.7 cm.  This lesion
was better visualized and characterized on MRI examination dated
11/24/2008

Left Kidney:  Measures 9.7 cm in length.  No hydronephrosis or
diagnostic renal calculus

Abdominal aorta:
Measures up to 2.4 cm in diameter.
IMPRESSION: 1.  No gallstones are noted within gallbladder.
2.  Limited exam due to abundant bowel gas.
3.  Again noted is a hypoechoic mass mid to upper pole of the right
kidney measures 2.7 cm.  This lesion was better characterized on
recent MRI examination of the abdomen probable representing renal
cell carcinoma.

## 2011-05-28 IMAGING — CR DG CHEST 2V
2 series · 2 of 2 positions shown · non-contrast
Comparison: 01/26/2007.

CLINICAL DATA: Preop for appendicitis.

CHEST - 2 VIEW

[view not recorded (1 of 2)]
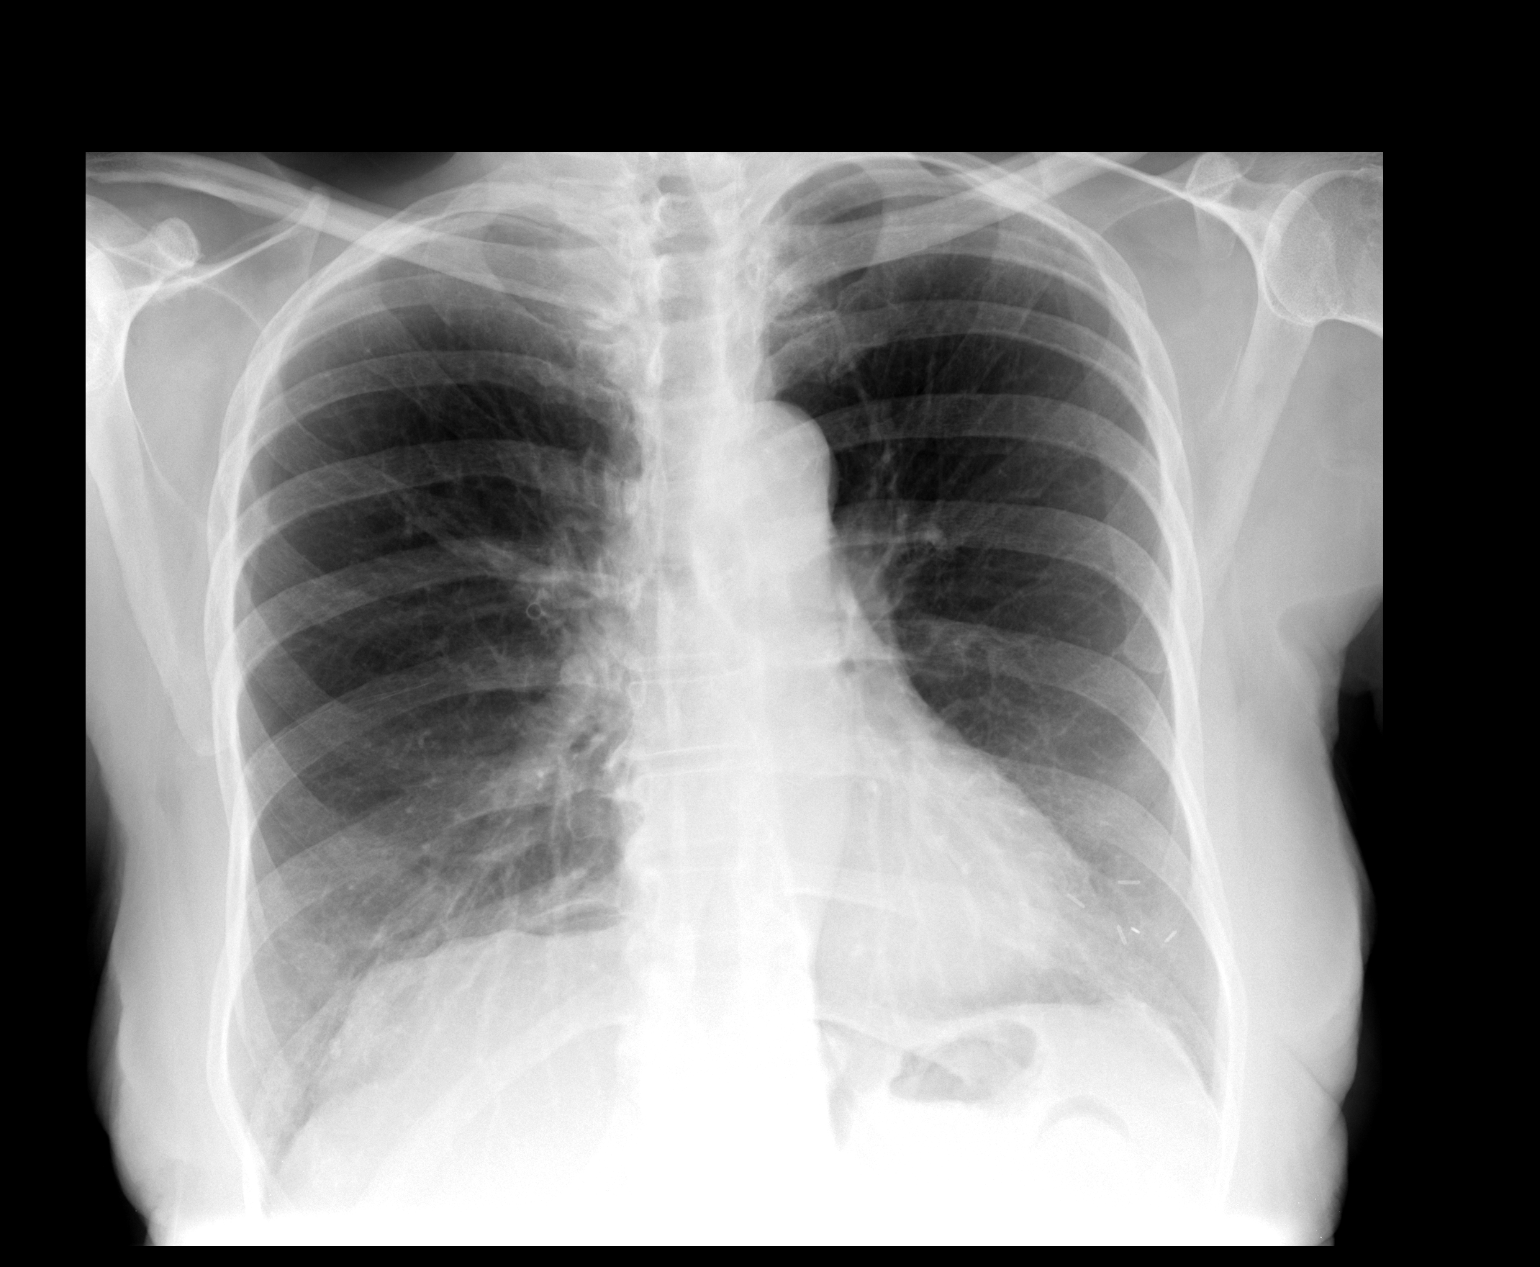

[view not recorded (2 of 2)]
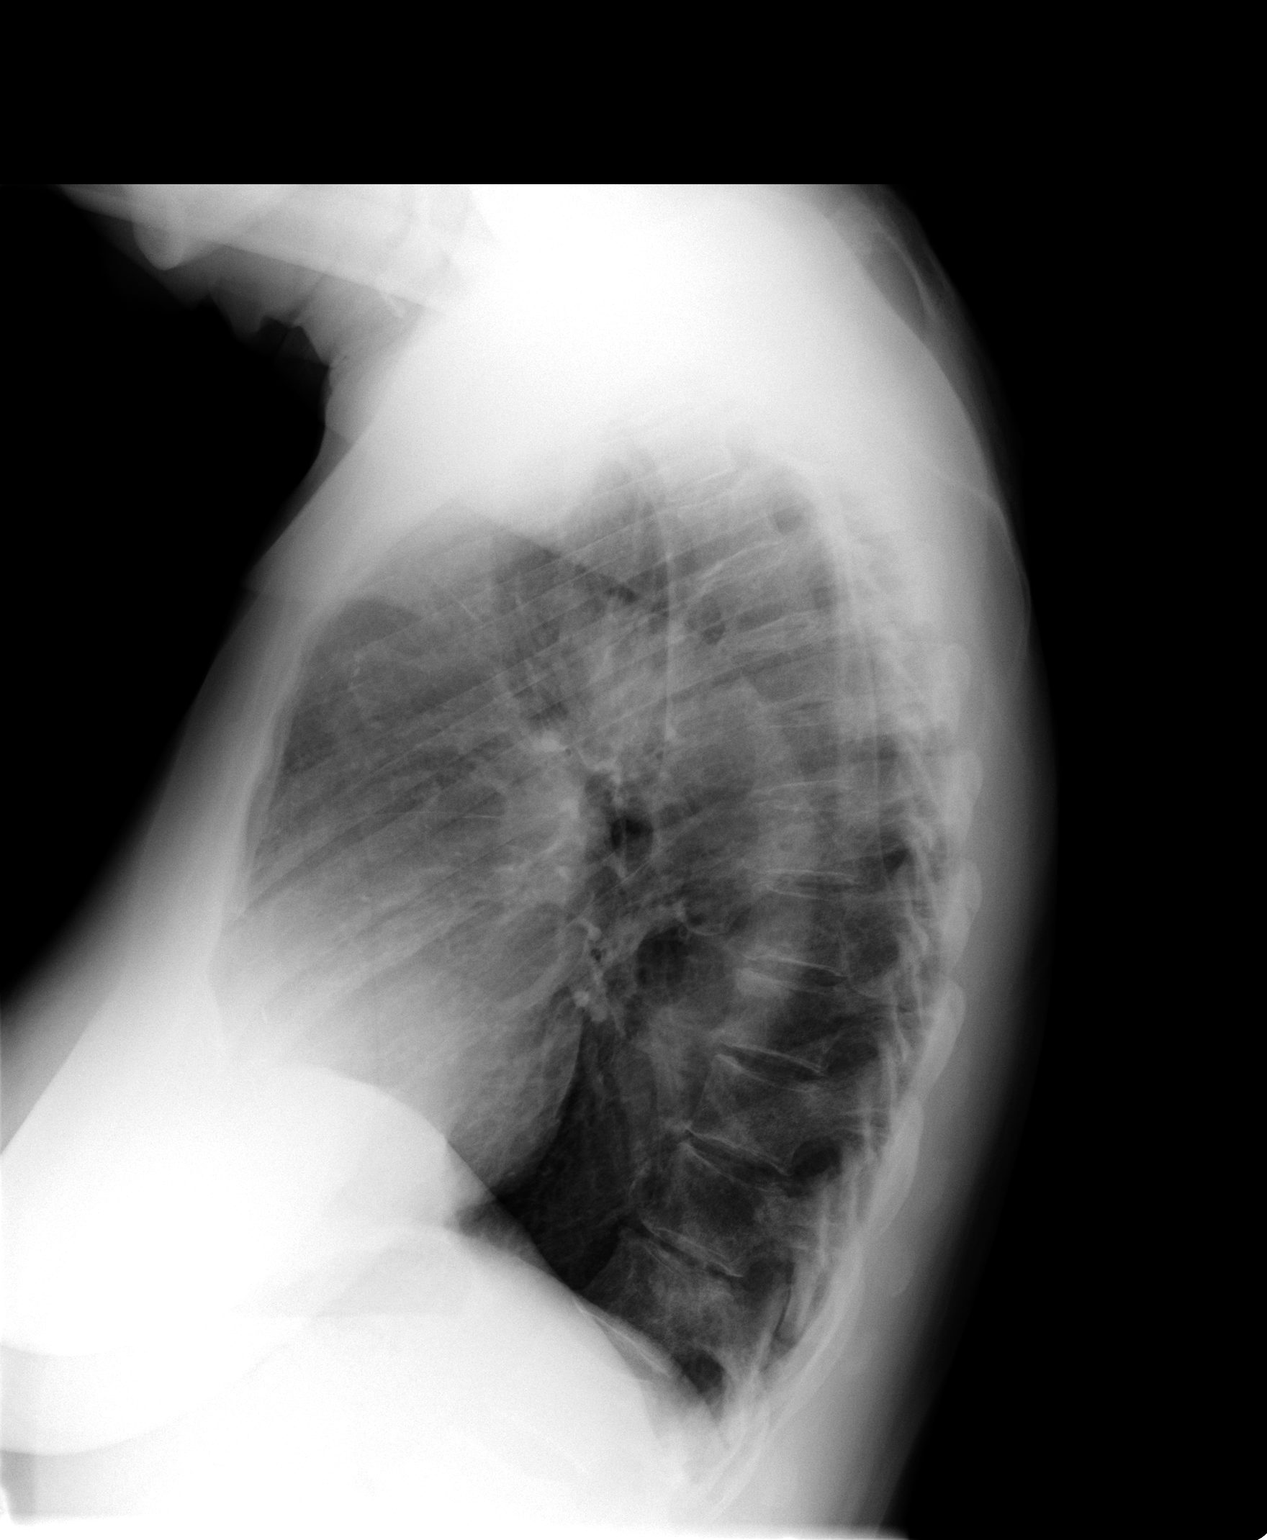

[2 of 2 positions shown; findings below may reference images not displayed]

FINDINGS: Slight prominence of the left atrium is again noted.  The
heart size is otherwise normal.  Minimal atelectasis is present at
the lung bases bilaterally.  Surgical clips are noted over the left
breast.
IMPRESSION: 1.  No acute cardiopulmonary disease.
2.  Stable postoperative changes.

## 2011-06-02 IMAGING — US US RENAL
1 series · 14 of 24 positions shown · non-contrast
Comparison: Previous CT and MRI studies of the abdomen.  The
latest CT scan is dated 02/17/2009.

CLINICAL DATA: Acute renal failure.  Status post appendectomy for
appendicitis and appendiceal adenocarcinoma.

RENAL/URINARY TRACT ULTRASOUND COMPLETE

[Series 1: us renal · 0.28mm/px · 14 of 24 slices shown]
[im 1/24]
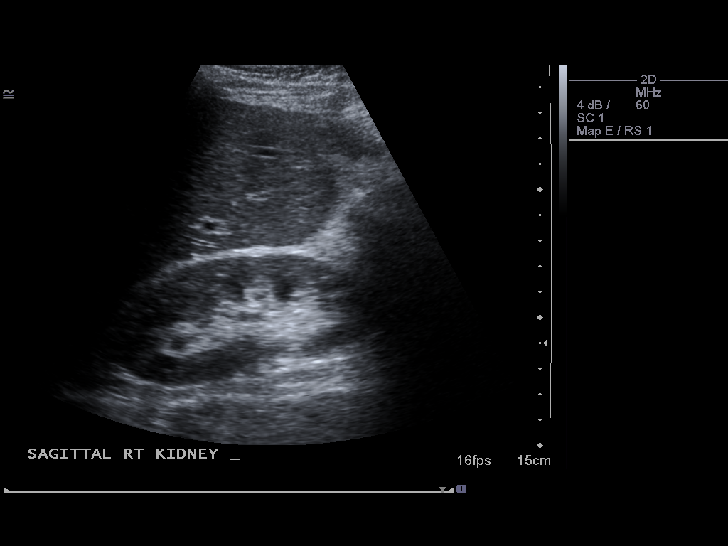
[im 3/24]
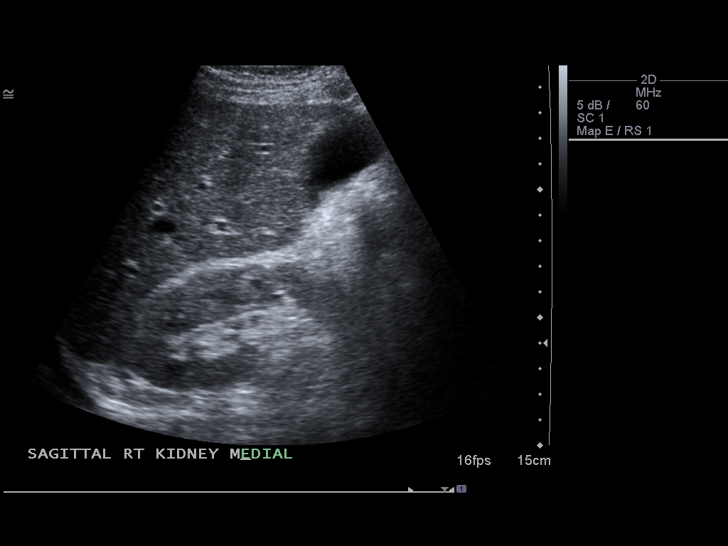
[im 5/24]
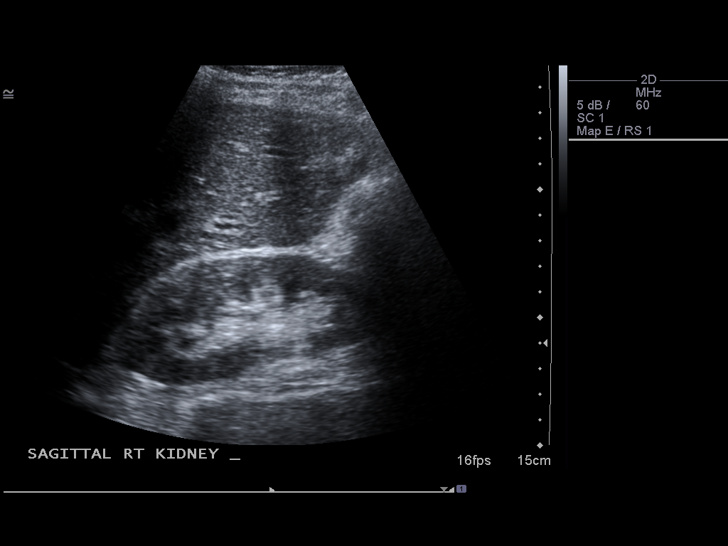
[im 7/24]
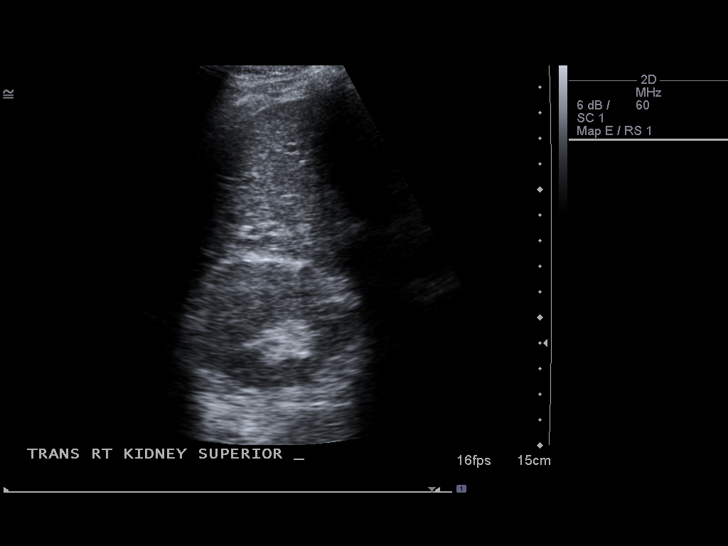
[im 8/24]
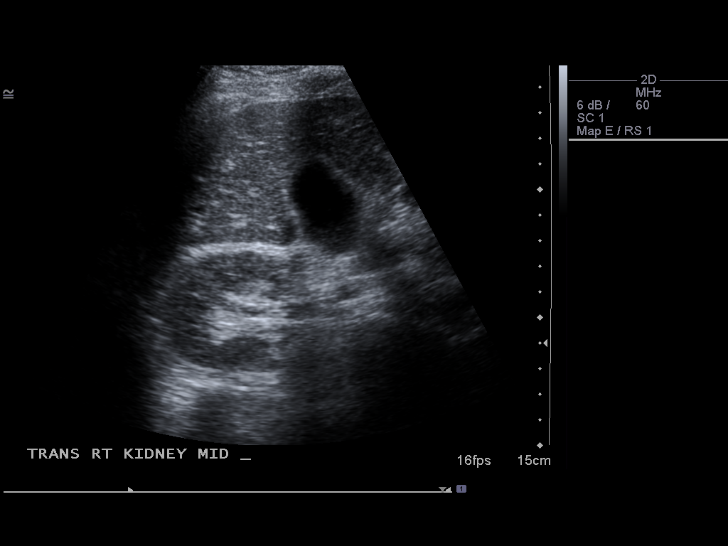
[im 10/24]
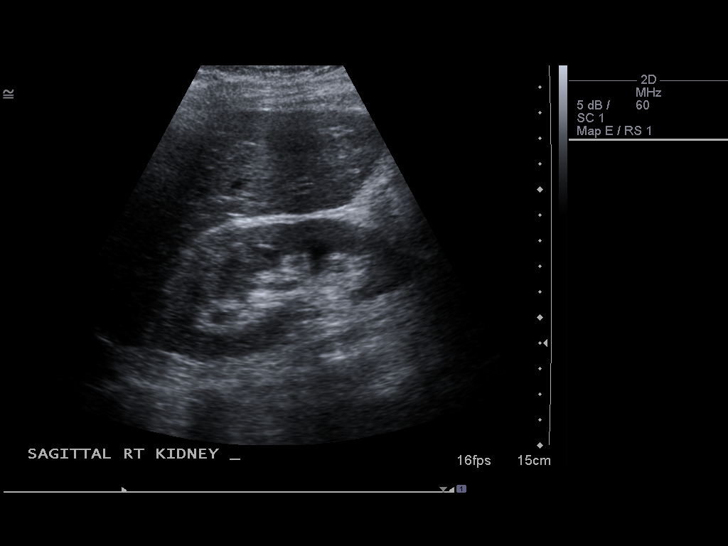
[im 12/24]
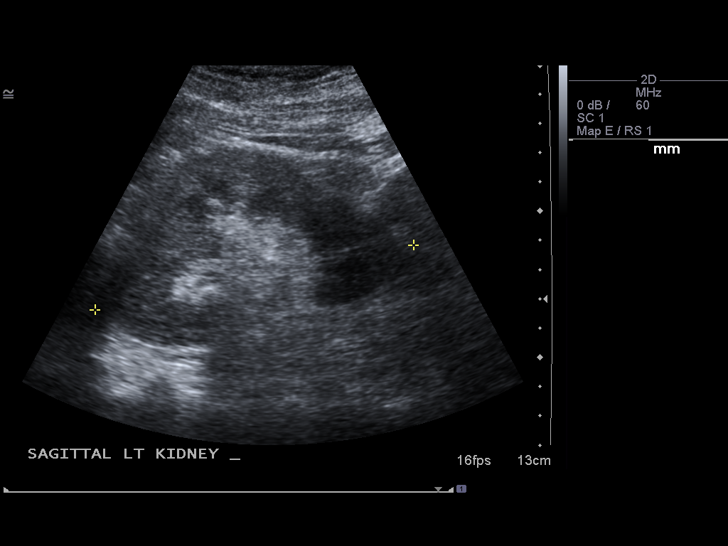
[im 13/24]
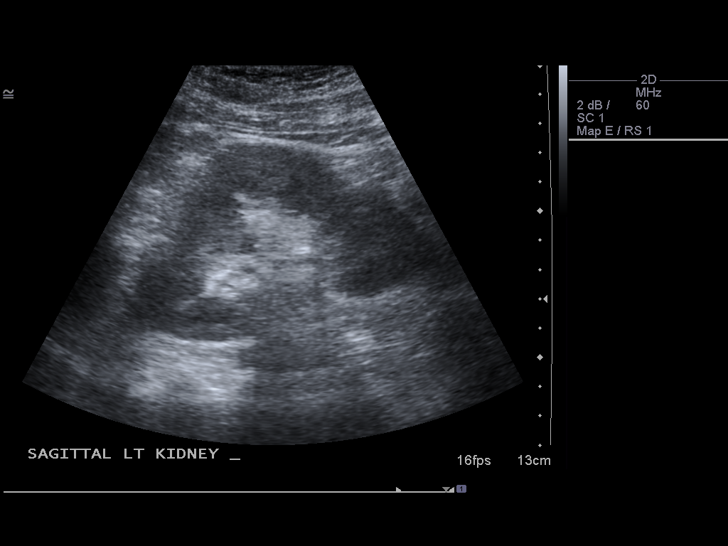
[im 15/24]
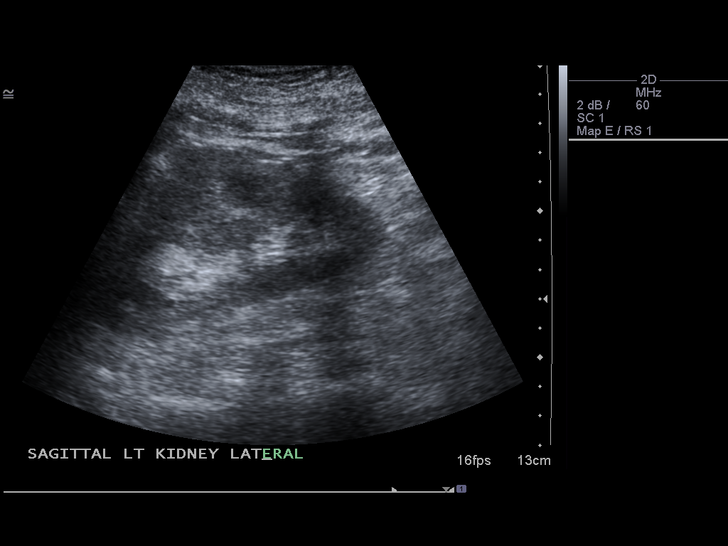
[im 17/24]
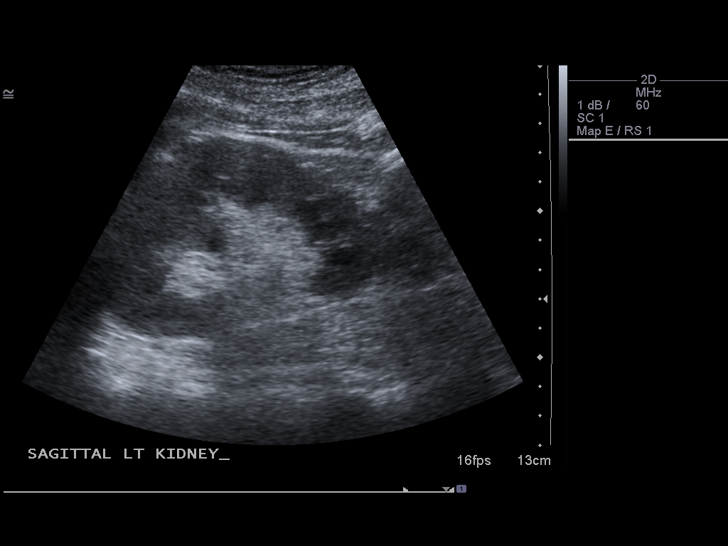
[im 19/24]
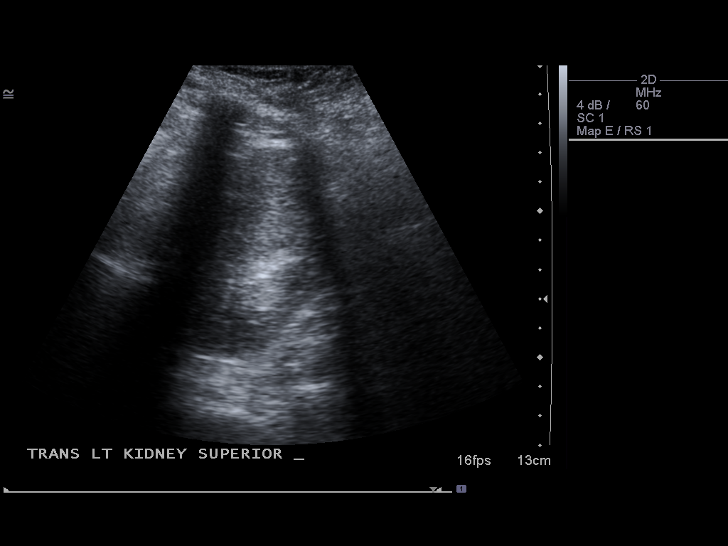
[im 20/24]
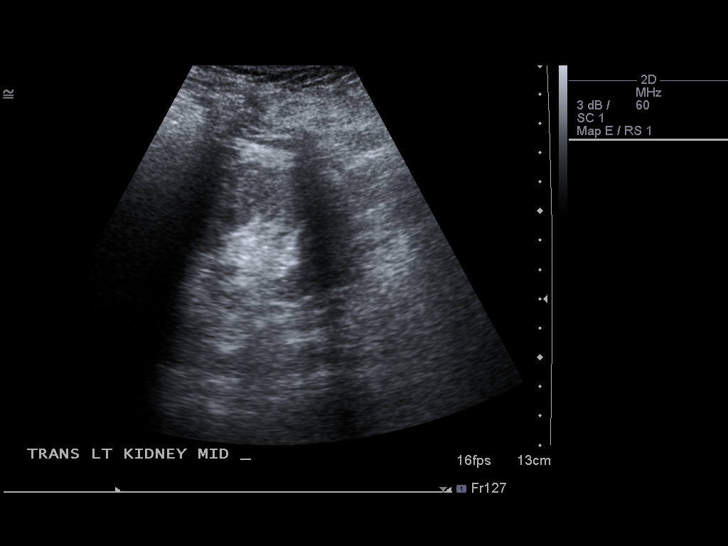
[im 22/24]
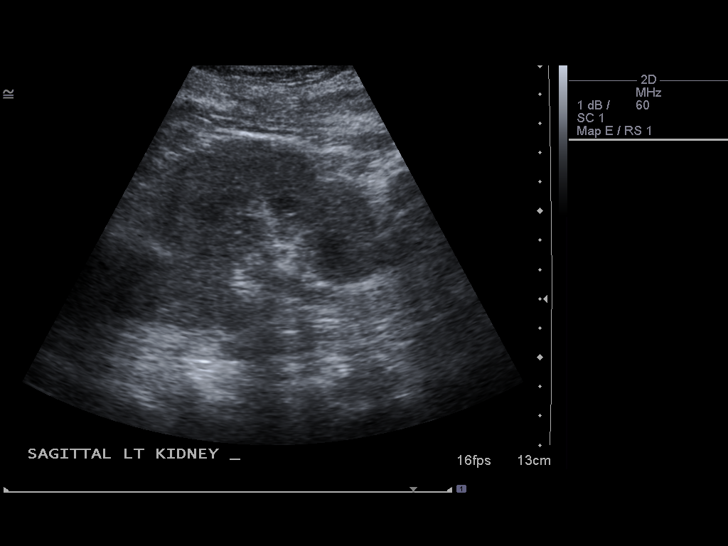
[im 24/24]
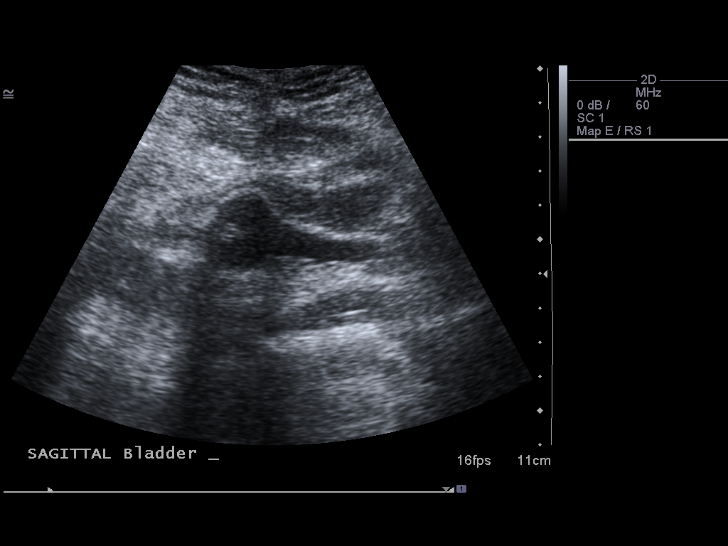

[14 of 24 positions shown; findings below may reference images not displayed]

FINDINGS: Right Kidney:  11.3 cm in length.  Round, solid mass again noted
emanating from the upper to mid kidney and measures approximately
2.5 to 2.9 cm.  This has been previously characterized.  There is
no evidence of hydronephrosis.

Left Kidney:  11.1 cm in length.  No evidence of hydronephrosis or
focal lesion.  Increased cortical echogenicity more apparent in the
left kidney that the right.  This is likely reflective of chronic
disease.

Bladder:  The bladder is decompressed by a Foley catheter.
IMPRESSION: Solid mass of upper to mid right kidney measuring approximately
to 2.9 cm.  This has been previously characterized.  No evidence of
renal obstruction.  Increased cortical echogenicity of the left
kidney suggests chronic disease.

## 2011-08-09 IMAGING — US US BIOPSY
1 series · 13 of 14 positions shown · non-contrast
Comparison: none

Clinical Data/Indication: Left lower quadrant subcutaneous mass.
Breast cancer.  Appendiceal cancer.

ULTRASOUND-GUIDED BIOPSYOF A SUBCUTANEOUS LEFT LOWER QUADRANT MASS.
CORE.
Sedation: Versed two mg, Fentanyl 50 mcg.
Total Moderate Sedation Time: 15 minutes.
Procedure: The procedure, risks, benefits, and alternatives were
explained to the patient. Questions regarding the procedure were
encouraged and answered. The patient understands and consents to
the procedure.
The left lower quadrant was prepped with betadine in a sterile
fashion, and a sterile drape was applied covering the operative
field. A sterile gown and sterile gloves were used for the
procedure.
Under sonographic guidance, a guide needle was inserted into the
subcutaneous mass.  Four 18 gauge core biopsies were obtained.
Final imaging was performed.
Patient tolerated the procedure well without complication.  Vital
sign monitoring by nursing staff during the procedure will continue
as patient is in the special procedures unit for post procedure
observation.

[Series 1: us biopsy · 0.07mm/px · 13 of 14 slices shown]
[im 1/14]
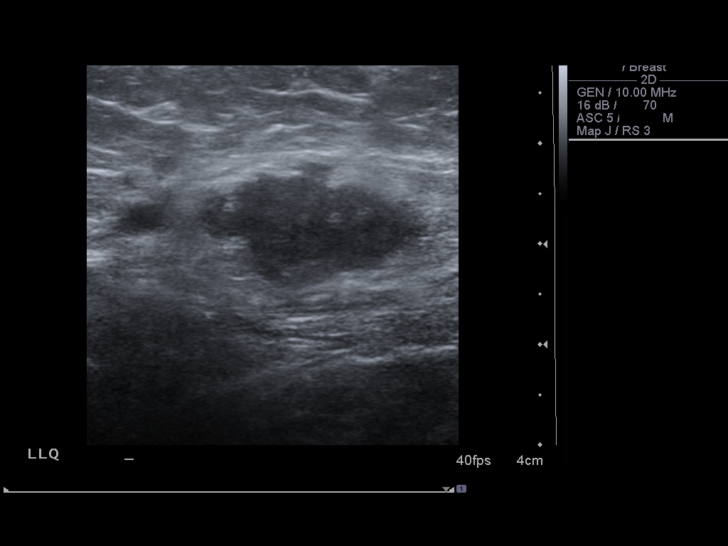
[im 2/14]
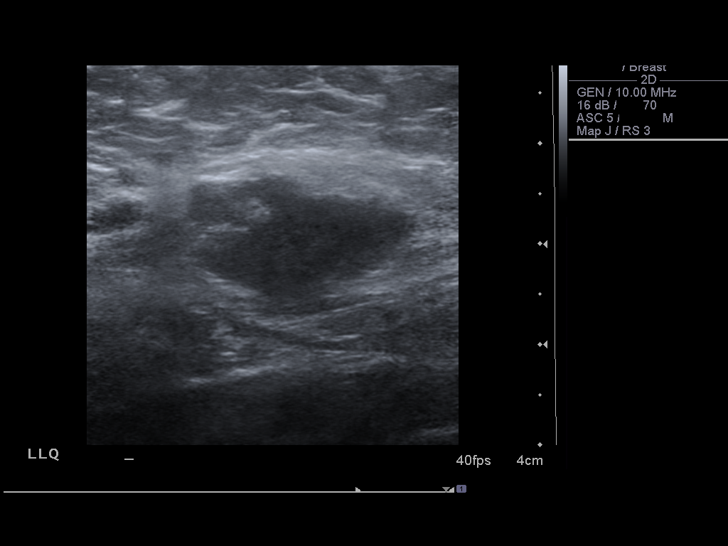
[im 3/14]
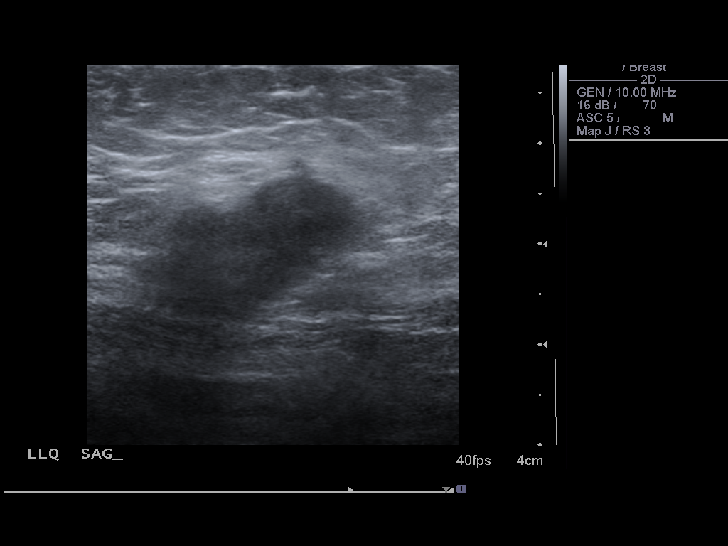
[im 4/14]
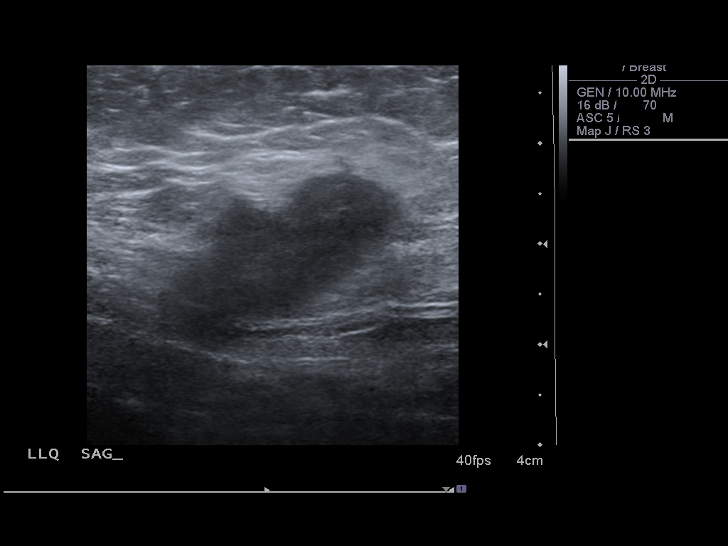
[im 5/14]
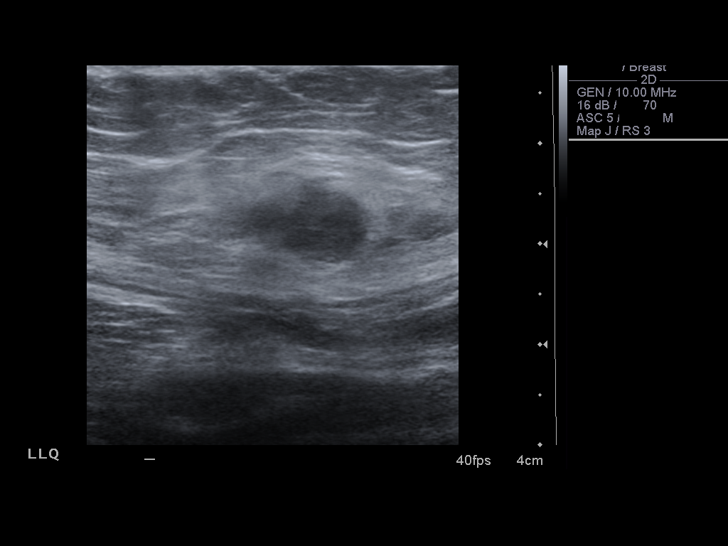
[im 6/14]
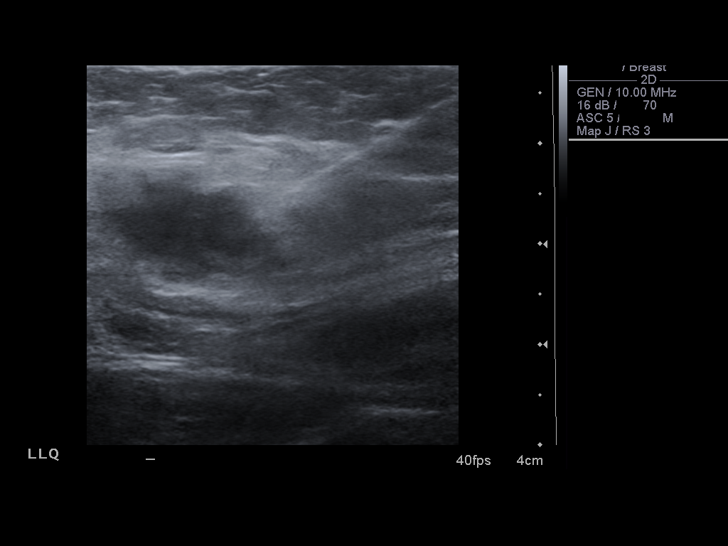
[im 8/14]
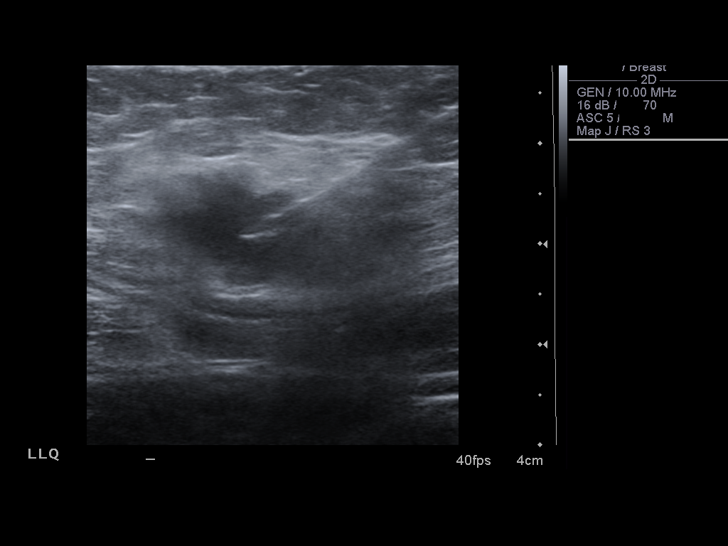
[im 9/14]
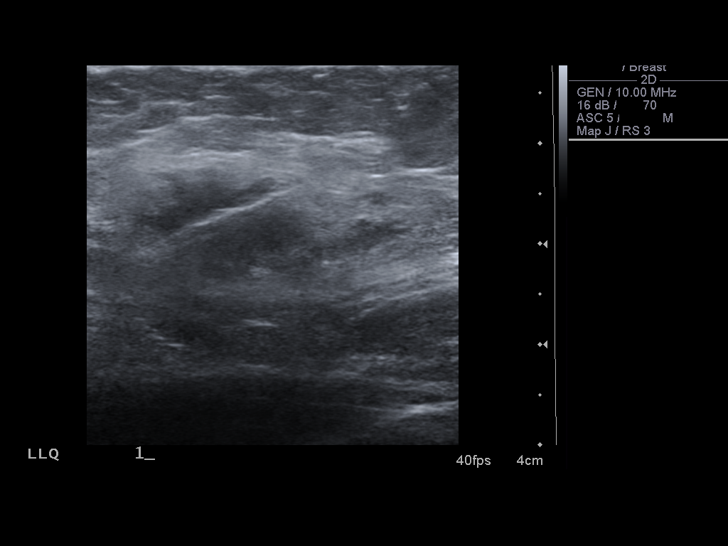
[im 10/14]
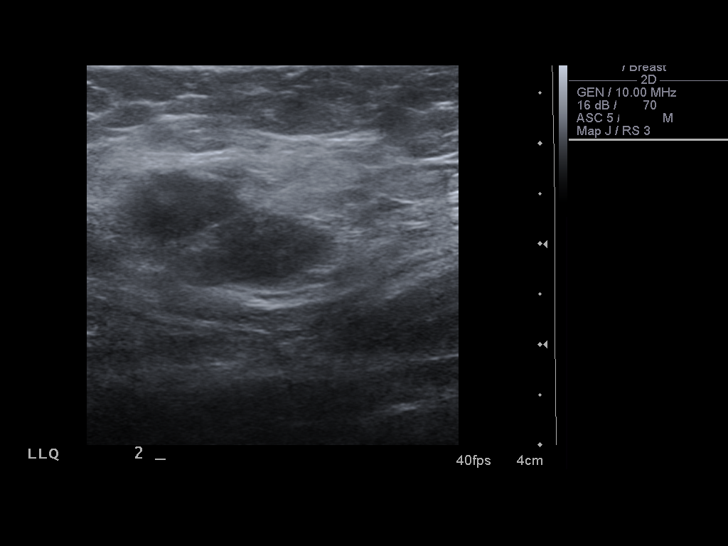
[im 11/14]
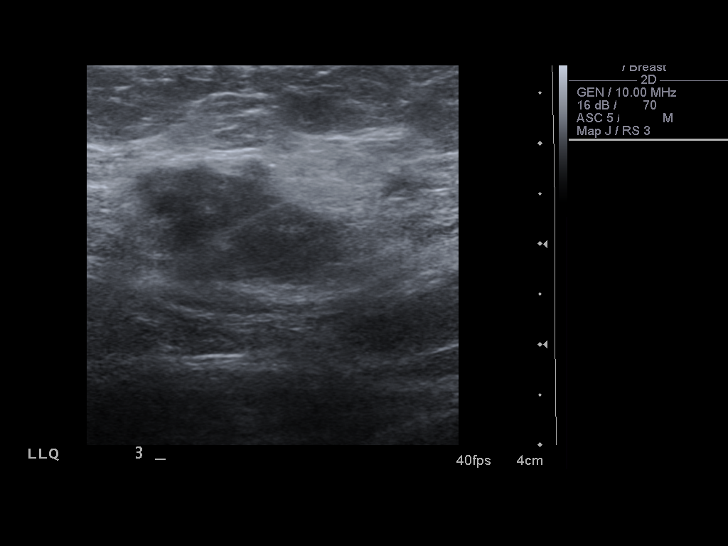
[im 12/14]
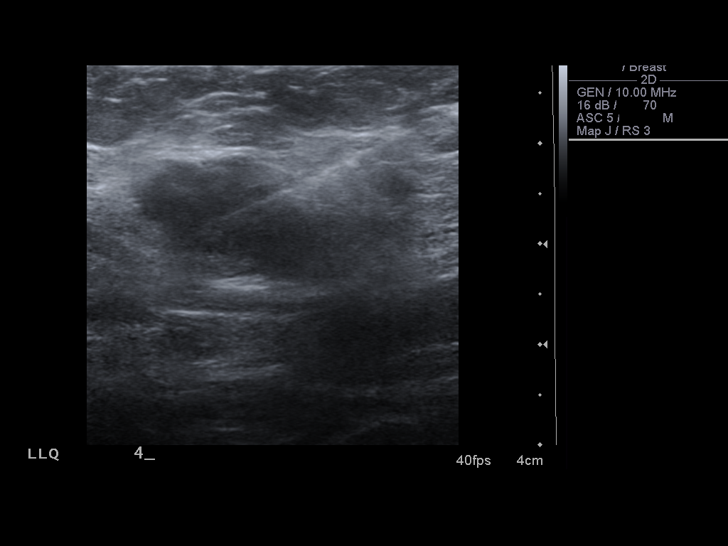
[im 13/14]
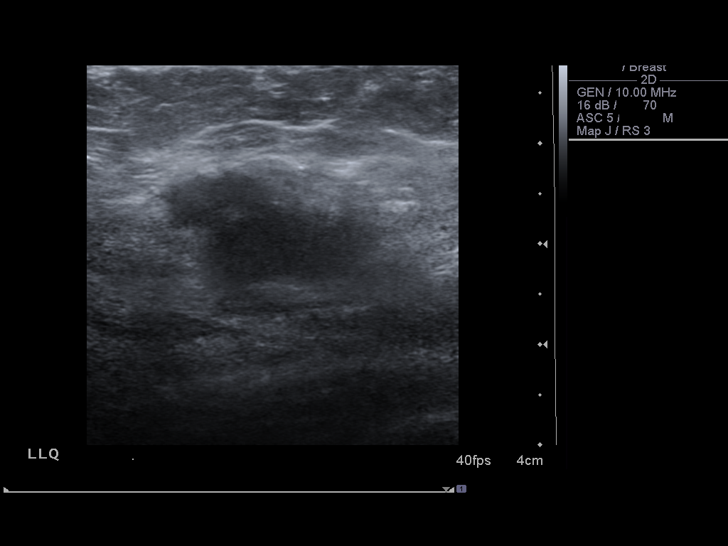
[im 14/14]
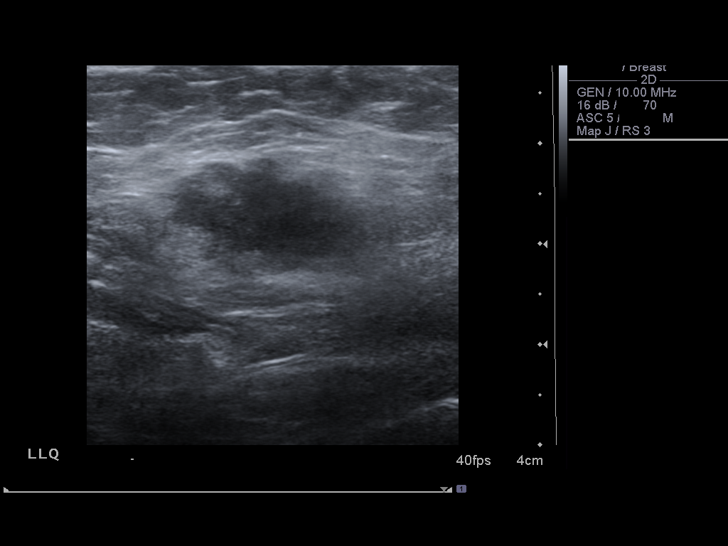

[13 of 14 positions shown; findings below may reference images not displayed]

FINDINGS: The images document guide needle placement within the
left lower quadrant subcutaneous mass. Post biopsy images
demonstrate no hemorrhage.
IMPRESSION: Successful ultrasound-guided core biopsy of a left lower quadrant
subcutaneous mass.

## 2012-05-10 ENCOUNTER — Encounter: Payer: Self-pay | Admitting: General Practice

## 2017-05-09 NOTE — Progress Notes (Signed)
This encounter was created in error - please disregard.
